# Patient Record
Sex: Female | Born: 1970
Health system: Southern US, Community
[De-identification: ages and names within clinical notes are randomized; demographics above are authoritative.]

## PROBLEM LIST (undated history)

## (undated) DIAGNOSIS — E669 Obesity, unspecified: Secondary | ICD-10-CM

## (undated) DIAGNOSIS — I10 Essential (primary) hypertension: Secondary | ICD-10-CM

## (undated) DIAGNOSIS — S86019A Strain of unspecified Achilles tendon, initial encounter: Secondary | ICD-10-CM

## (undated) DIAGNOSIS — F329 Major depressive disorder, single episode, unspecified: Secondary | ICD-10-CM

## (undated) DIAGNOSIS — F319 Bipolar disorder, unspecified: Secondary | ICD-10-CM

## (undated) DIAGNOSIS — F32A Depression, unspecified: Secondary | ICD-10-CM

## (undated) DIAGNOSIS — E782 Mixed hyperlipidemia: Secondary | ICD-10-CM

## (undated) HISTORY — PX: BREAST BIOPSY: SHX20

## (undated) HISTORY — DX: Mixed hyperlipidemia: E78.2

## (undated) HISTORY — PX: BREAST EXCISIONAL BIOPSY: SUR124

## (undated) HISTORY — DX: Major depressive disorder, single episode, unspecified: F32.9

## (undated) HISTORY — DX: Essential (primary) hypertension: I10

## (undated) HISTORY — PX: LASIK: SHX215

## (undated) HISTORY — DX: Obesity, unspecified: E66.9

## (undated) HISTORY — DX: Strain of unspecified achilles tendon, initial encounter: S86.019A

## (undated) HISTORY — DX: Depression, unspecified: F32.A

---

## 1997-09-11 ENCOUNTER — Other Ambulatory Visit: Admission: RE | Admit: 1997-09-11 | Discharge: 1997-09-11 | Payer: Self-pay | Admitting: Obstetrics and Gynecology

## 1998-09-24 ENCOUNTER — Other Ambulatory Visit: Admission: RE | Admit: 1998-09-24 | Discharge: 1998-09-24 | Payer: Self-pay | Admitting: Obstetrics and Gynecology

## 1999-03-14 ENCOUNTER — Encounter: Payer: Self-pay | Admitting: Obstetrics and Gynecology

## 1999-03-14 ENCOUNTER — Inpatient Hospital Stay (HOSPITAL_COMMUNITY): Admission: AD | Admit: 1999-03-14 | Discharge: 1999-03-14 | Payer: Self-pay | Admitting: Obstetrics and Gynecology

## 1999-04-19 ENCOUNTER — Other Ambulatory Visit: Admission: RE | Admit: 1999-04-19 | Discharge: 1999-04-19 | Payer: Self-pay | Admitting: Obstetrics and Gynecology

## 1999-11-14 ENCOUNTER — Inpatient Hospital Stay (HOSPITAL_COMMUNITY): Admission: AD | Admit: 1999-11-14 | Discharge: 1999-11-17 | Payer: Self-pay | Admitting: Obstetrics and Gynecology

## 1999-11-18 ENCOUNTER — Encounter (HOSPITAL_COMMUNITY): Admission: RE | Admit: 1999-11-18 | Discharge: 2000-01-17 | Payer: Self-pay | Admitting: Obstetrics and Gynecology

## 2000-01-02 ENCOUNTER — Other Ambulatory Visit: Admission: RE | Admit: 2000-01-02 | Discharge: 2000-01-02 | Payer: Self-pay | Admitting: Obstetrics and Gynecology

## 2000-01-31 HISTORY — PX: LAPAROSCOPIC CHOLECYSTECTOMY: SUR755

## 2001-01-18 ENCOUNTER — Other Ambulatory Visit: Admission: RE | Admit: 2001-01-18 | Discharge: 2001-01-18 | Payer: Self-pay | Admitting: Obstetrics and Gynecology

## 2002-01-28 ENCOUNTER — Other Ambulatory Visit: Admission: RE | Admit: 2002-01-28 | Discharge: 2002-01-28 | Payer: Self-pay | Admitting: Obstetrics and Gynecology

## 2003-02-13 ENCOUNTER — Other Ambulatory Visit: Admission: RE | Admit: 2003-02-13 | Discharge: 2003-02-13 | Payer: Self-pay | Admitting: Obstetrics and Gynecology

## 2004-02-19 ENCOUNTER — Other Ambulatory Visit: Admission: RE | Admit: 2004-02-19 | Discharge: 2004-02-19 | Payer: Self-pay | Admitting: Obstetrics and Gynecology

## 2004-03-01 ENCOUNTER — Encounter: Admission: RE | Admit: 2004-03-01 | Discharge: 2004-03-01 | Payer: Self-pay | Admitting: Obstetrics and Gynecology

## 2004-10-14 ENCOUNTER — Other Ambulatory Visit: Admission: RE | Admit: 2004-10-14 | Discharge: 2004-10-14 | Payer: Self-pay | Admitting: Obstetrics and Gynecology

## 2005-03-24 ENCOUNTER — Inpatient Hospital Stay (HOSPITAL_COMMUNITY): Admission: AD | Admit: 2005-03-24 | Discharge: 2005-03-24 | Payer: Self-pay | Admitting: Obstetrics and Gynecology

## 2005-04-21 ENCOUNTER — Inpatient Hospital Stay (HOSPITAL_COMMUNITY): Admission: AD | Admit: 2005-04-21 | Discharge: 2005-04-23 | Payer: Self-pay | Admitting: Obstetrics and Gynecology

## 2005-10-15 ENCOUNTER — Inpatient Hospital Stay (HOSPITAL_COMMUNITY): Admission: EM | Admit: 2005-10-15 | Discharge: 2005-10-28 | Payer: Self-pay | Admitting: Emergency Medicine

## 2005-11-09 ENCOUNTER — Encounter: Admission: RE | Admit: 2005-11-09 | Discharge: 2005-11-09 | Payer: Self-pay | Admitting: General Surgery

## 2005-11-24 ENCOUNTER — Encounter: Admission: RE | Admit: 2005-11-24 | Discharge: 2005-11-24 | Payer: Self-pay | Admitting: General Surgery

## 2005-12-27 ENCOUNTER — Encounter: Admission: RE | Admit: 2005-12-27 | Discharge: 2005-12-27 | Payer: Self-pay | Admitting: General Surgery

## 2005-12-29 ENCOUNTER — Encounter: Admission: RE | Admit: 2005-12-29 | Discharge: 2005-12-29 | Payer: Self-pay | Admitting: General Surgery

## 2006-01-01 ENCOUNTER — Encounter: Admission: RE | Admit: 2006-01-01 | Discharge: 2006-01-01 | Payer: Self-pay | Admitting: General Surgery

## 2006-01-09 ENCOUNTER — Encounter (INDEPENDENT_AMBULATORY_CARE_PROVIDER_SITE_OTHER): Payer: Self-pay | Admitting: *Deleted

## 2006-01-09 ENCOUNTER — Inpatient Hospital Stay (HOSPITAL_COMMUNITY): Admission: RE | Admit: 2006-01-09 | Discharge: 2006-01-17 | Payer: Self-pay | Admitting: General Surgery

## 2006-03-22 ENCOUNTER — Encounter: Admission: RE | Admit: 2006-03-22 | Discharge: 2006-03-22 | Payer: Self-pay | Admitting: General Surgery

## 2007-10-31 ENCOUNTER — Ambulatory Visit (HOSPITAL_COMMUNITY): Admission: RE | Admit: 2007-10-31 | Discharge: 2007-10-31 | Payer: Self-pay | Admitting: Obstetrics and Gynecology

## 2007-11-15 IMAGING — US US ABDOMEN COMPLETE
1 series · 14 of 25 positions shown · non-contrast
Comparison: none

CLINICAL DATA: Abdominal pain.

[Series 1: unknown · 0.32mm/px · 14 of 64 slices shown]
[im 1/64]
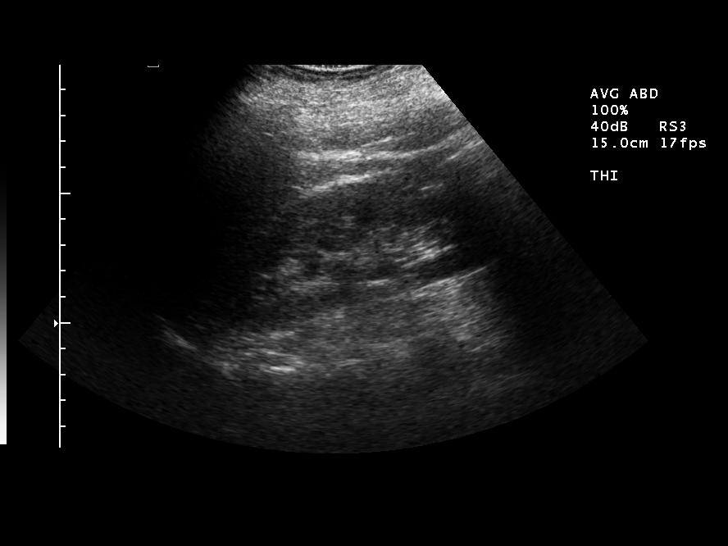
[im 6/64]
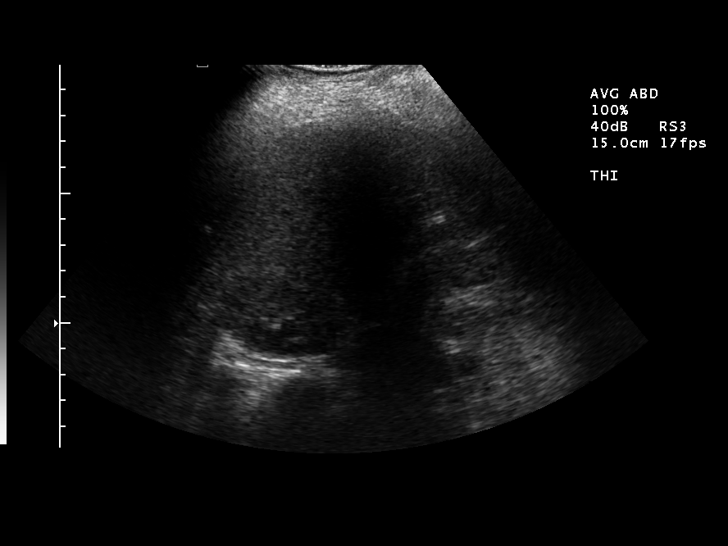
[im 11/64]
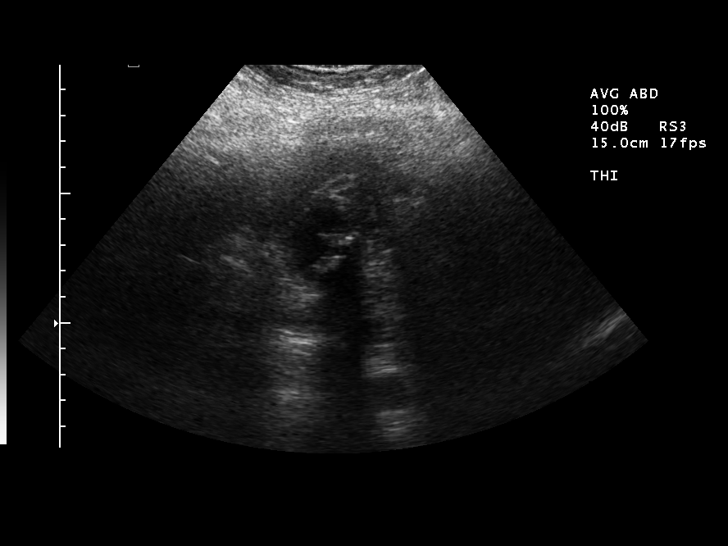
[im 16/64]
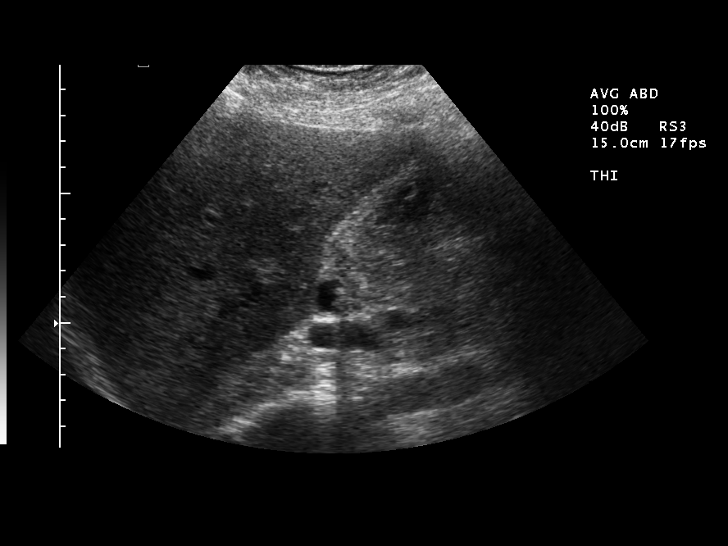
[im 22/64]
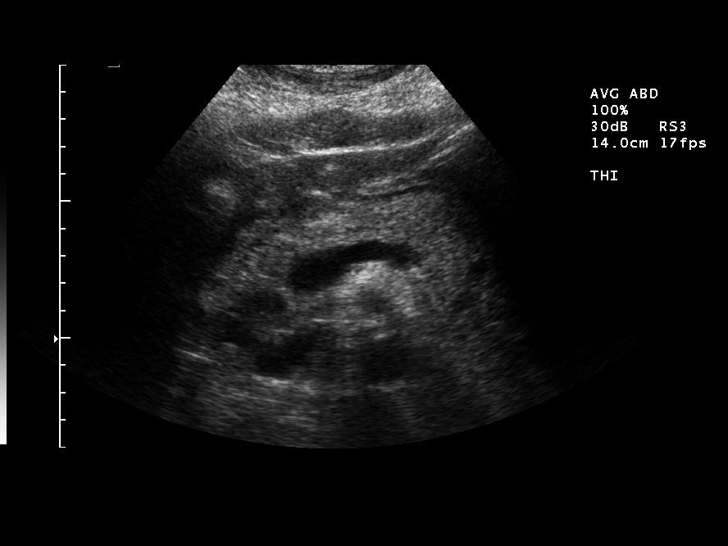
[im 24/64]
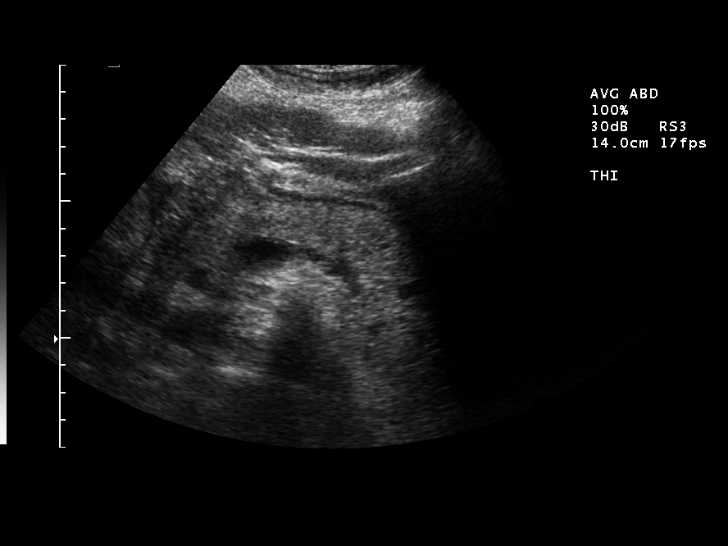
[im 29/64]
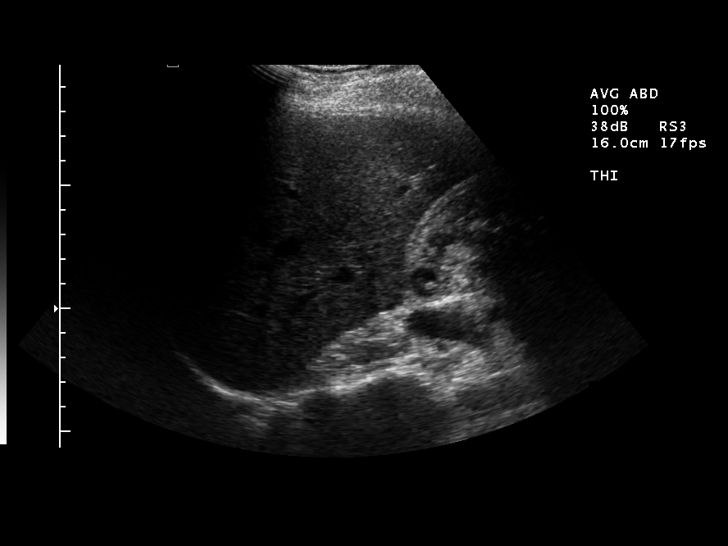
[im 35/64]
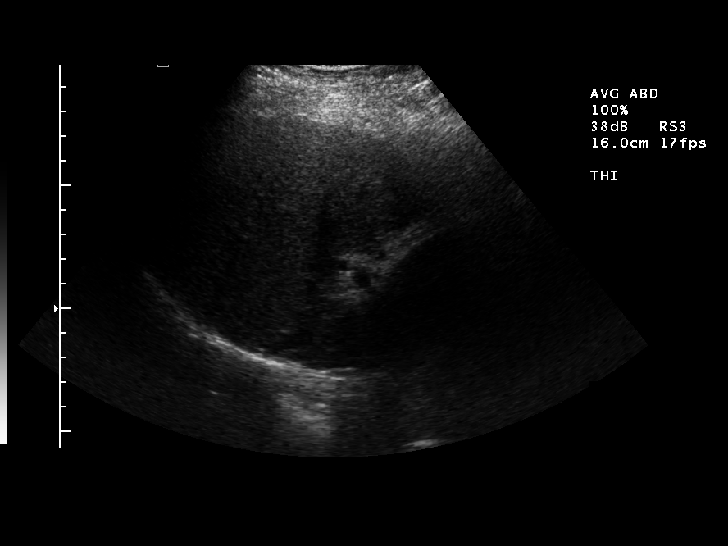
[im 40/64]
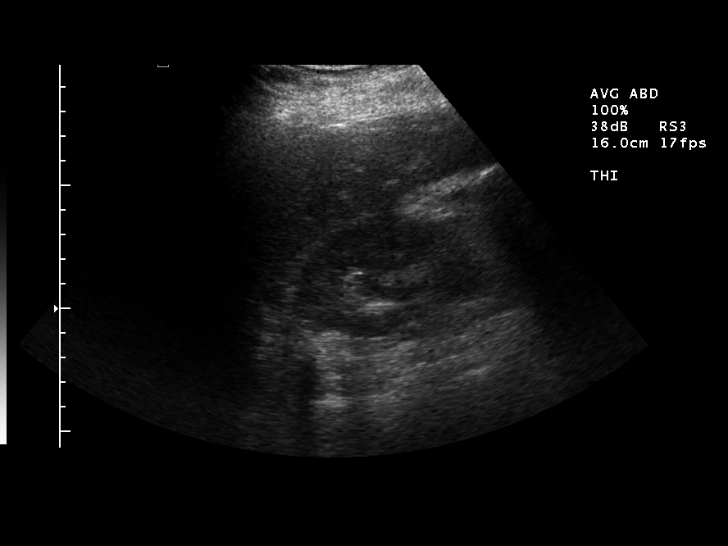
[im 43/64]
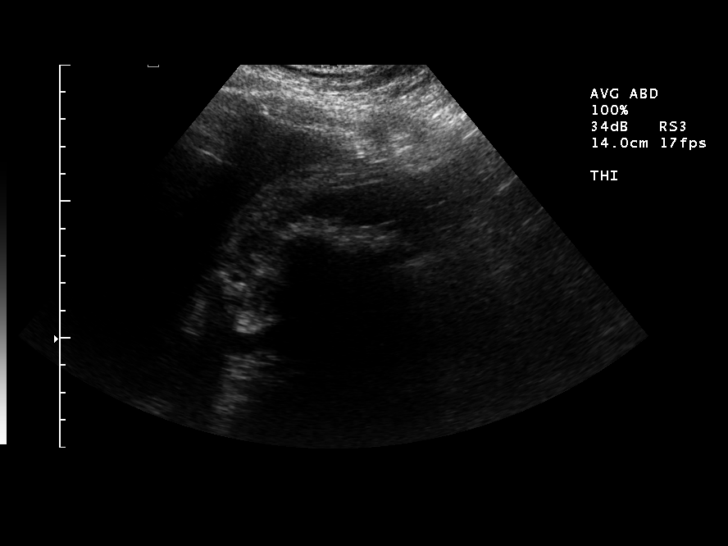
[im 48/64]
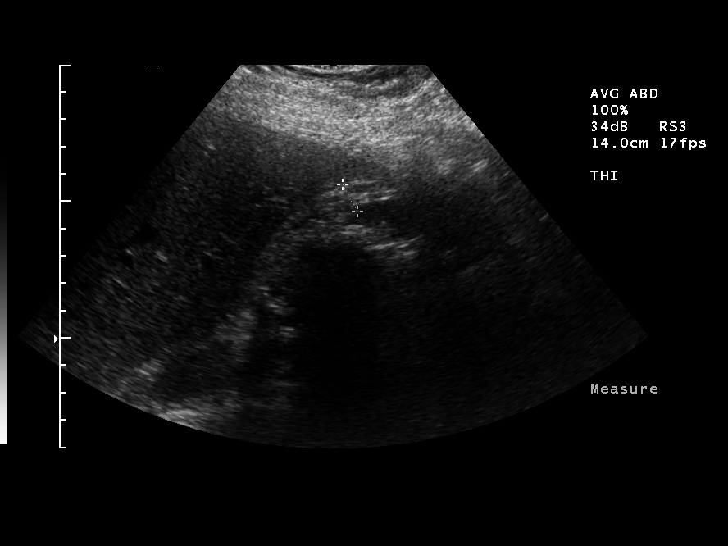
[im 53/64]
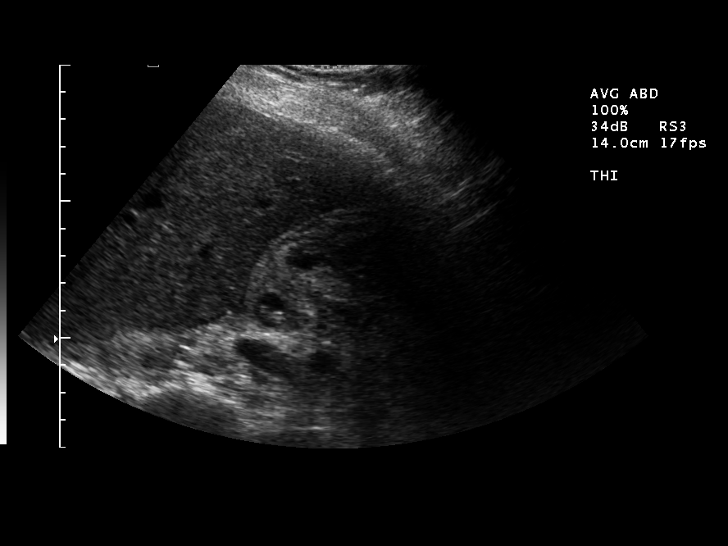
[im 58/64]
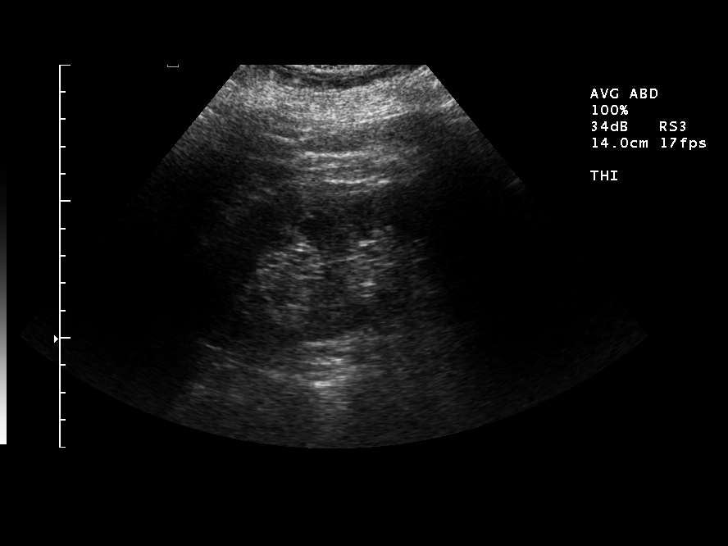
[im 64/64]
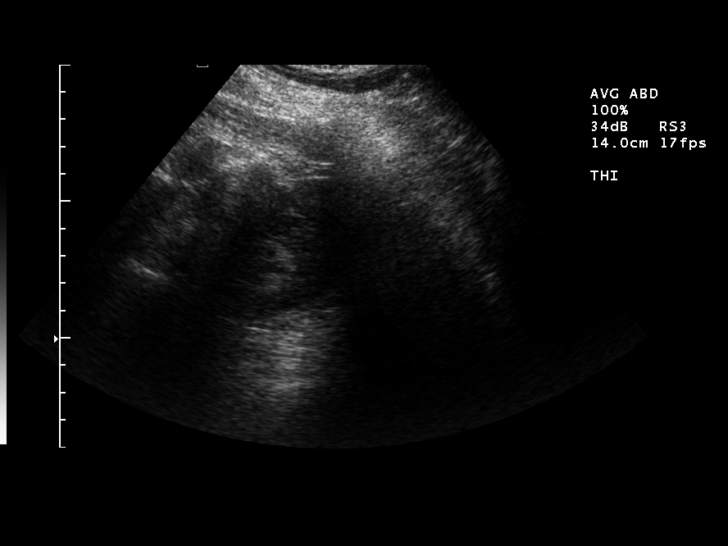

[14 of 25 positions shown; findings below may reference images not displayed]

Ultrasound abdomen:

No previous available for comparison. Gallbladder is incompletely distending
containing innumerable subcentimeter stones layering in its dependent portion.
There is marked gallbladder wall thickening measuring up to 11 mm thickness. No
definite pericholecystic fluid. Common bile duct is dilated, 7.9 mm diameter.
Liver homogeneous in echotexture without focal lesion or definite intrahepatic
bile duct dilatation. Visualized portions of pancreas, IVC, abdominal aorta
unremarkable. Spleen and kidneys unremarkable.
IMPRESSION: 1. Cholelithiasis and marked gallbladder wall thickening suggesting
cholecystitis. There is mild dilatation of the common bile duct. The findings
were reviewed over the phone with Dr. Misse.

## 2007-11-15 IMAGING — CT CT ABDOMEN W/ CM
2 of 5 series · 16 of 46 positions shown, 18 images · IV contrast (APPLIED)
Comparison: Ultrasound of earlier today.
COMPARISON: None.

CLINICAL DATA: Abdominal pain.  Ultrasound findings suspicious for cholecystitis.
 ABDOMEN CT WITH CONTRAST:
TECHNIQUE: Multidetector CT imaging of the abdomen was performed following the standard protocol during bolus administration of intravenous contrast.
 Contrast:  125 cc Omnipaque 300.
TECHNIQUE: Multidetector CT imaging of the pelvis was performed following the standard protocol during bolus administration of intravenous contrast.

[Series 2: abd_pel 5.0 b40f st · axial · 0.73mm/px · z∈[+906,+1306]mm · 13 of 91 slices shown, 15 images]
[im 6/91  soft-tissue]
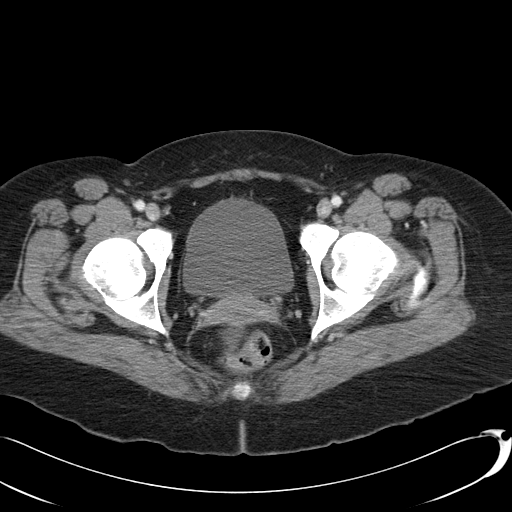
[im 6/91  bone]
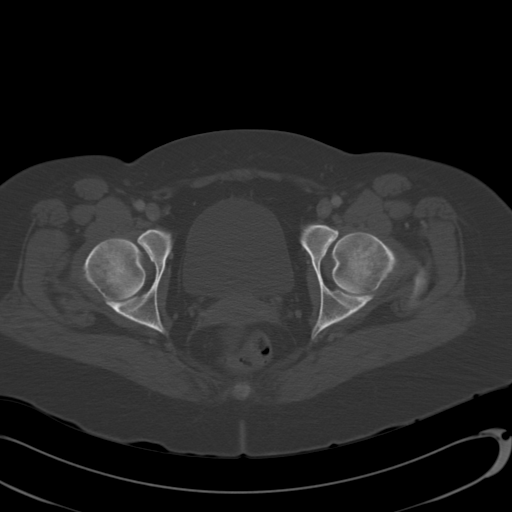
[im 11/91  soft-tissue]
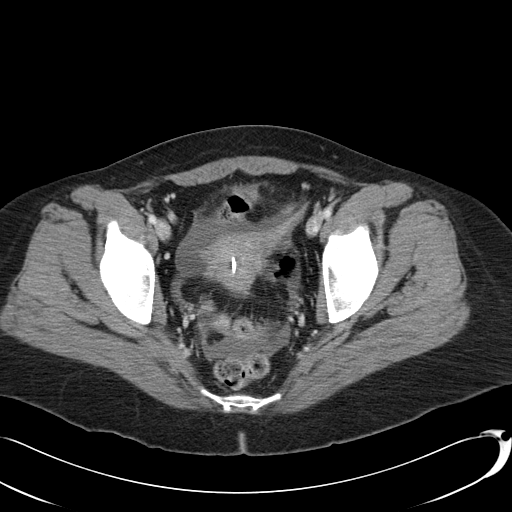
[im 21/91  soft-tissue]
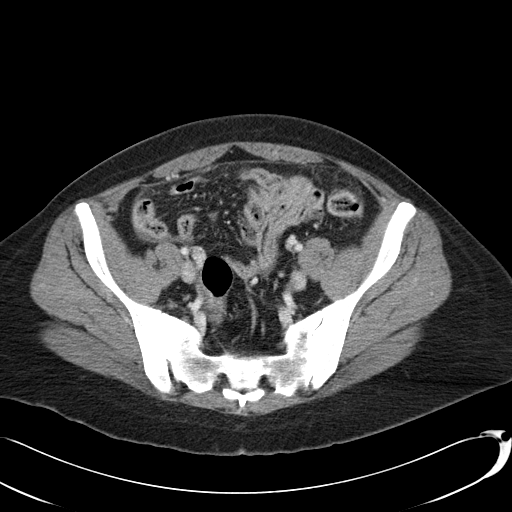
[im 26/91  soft-tissue]
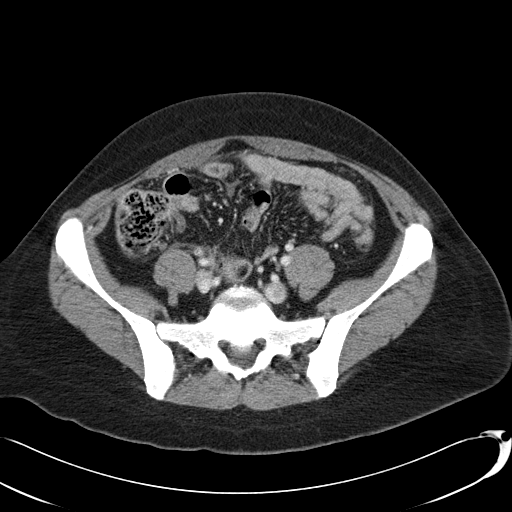
[im 31/91  soft-tissue]
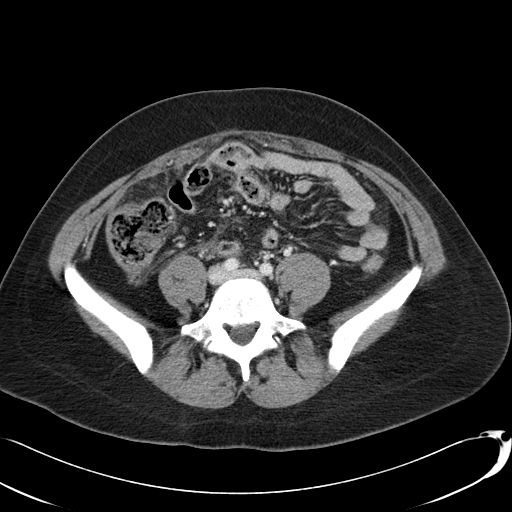
[im 41/91  soft-tissue]
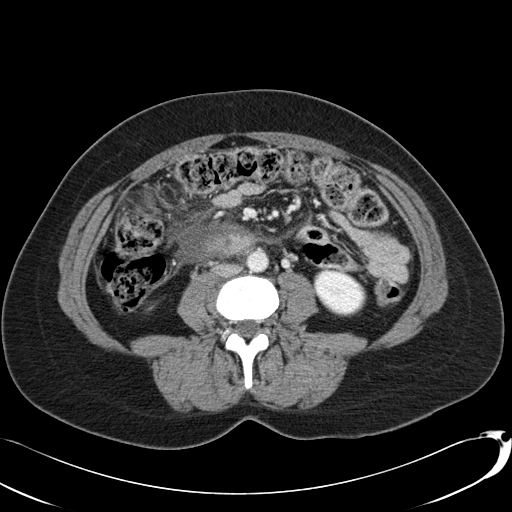
[im 46/91  soft-tissue]
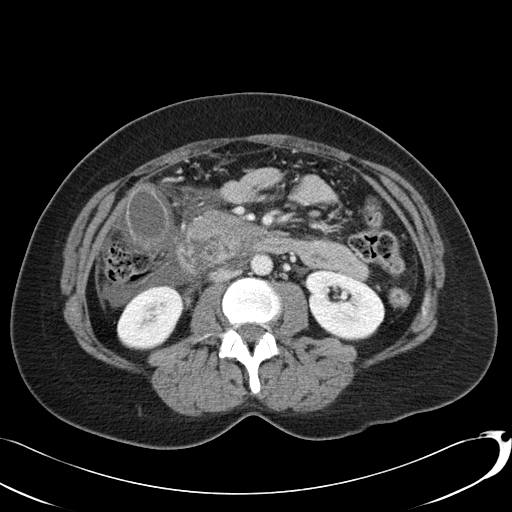
[im 51/91  soft-tissue]
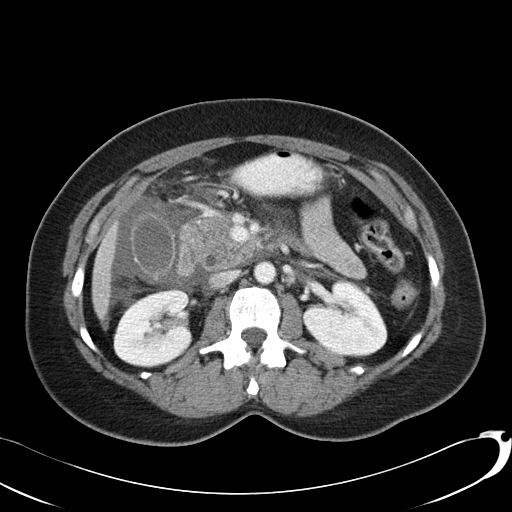
[im 61/91  soft-tissue]
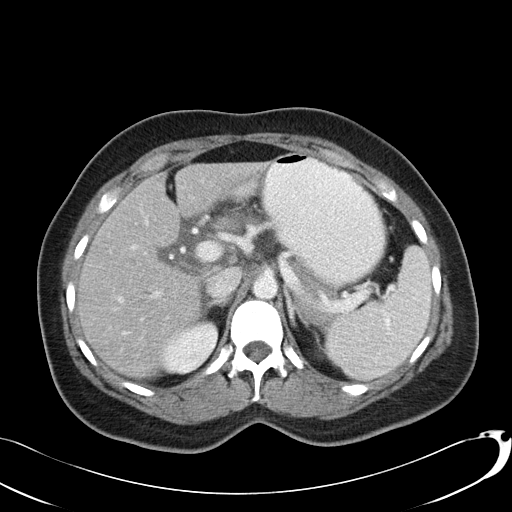
[im 61/91  bone]
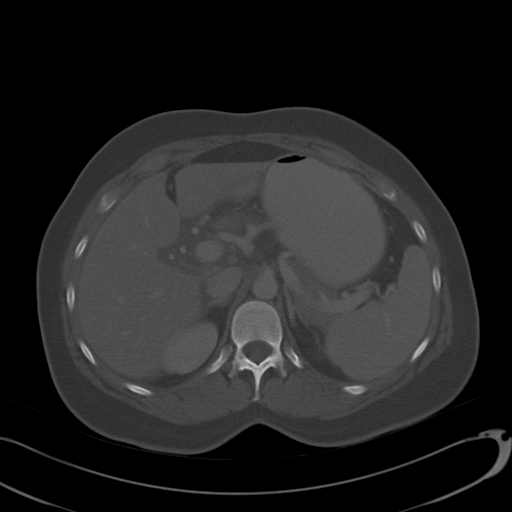
[im 66/91  soft-tissue]
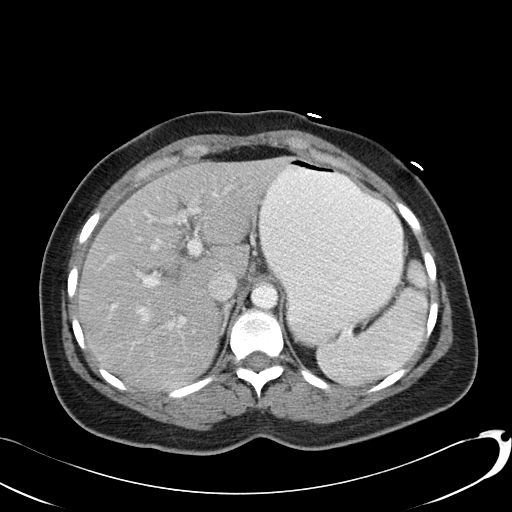
[im 71/91  soft-tissue]
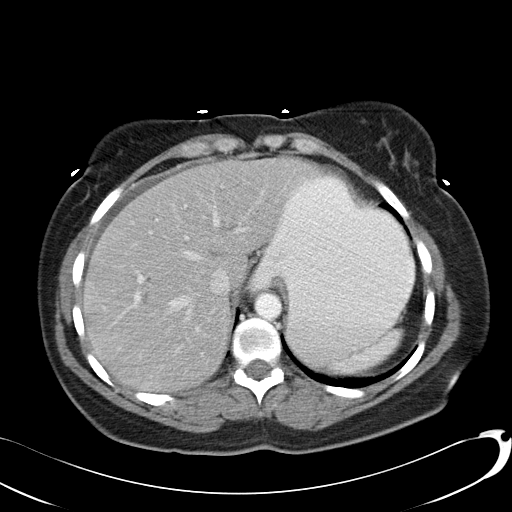
[im 81/91  soft-tissue]
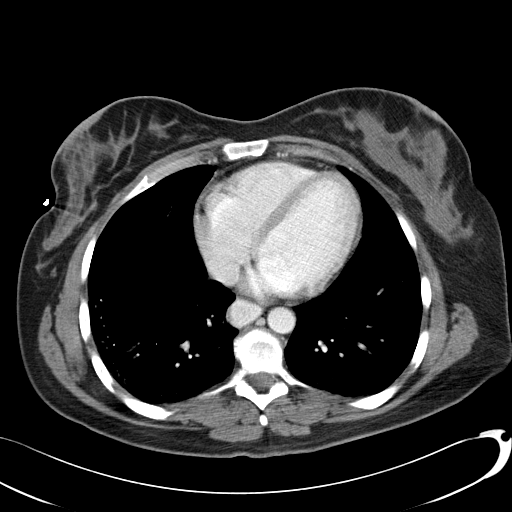
[im 86/91  soft-tissue]
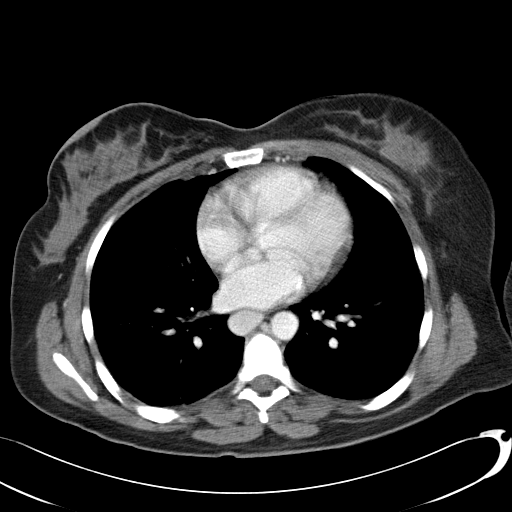

[Series 602: <mpr range>coronal · coronal · 0.92mm/px · 3 of 119 slices shown]
[im 40/119  soft-tissue]
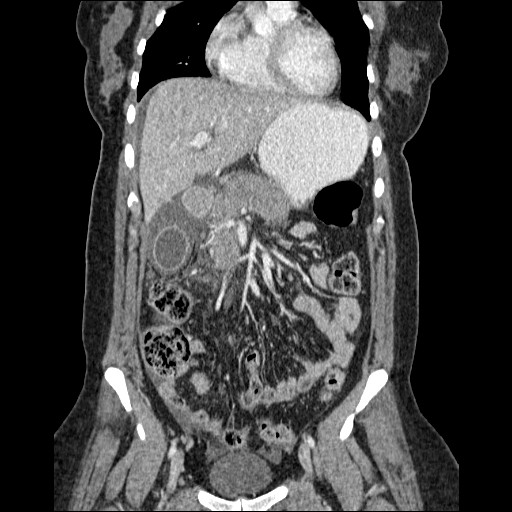
[im 53/119  soft-tissue]
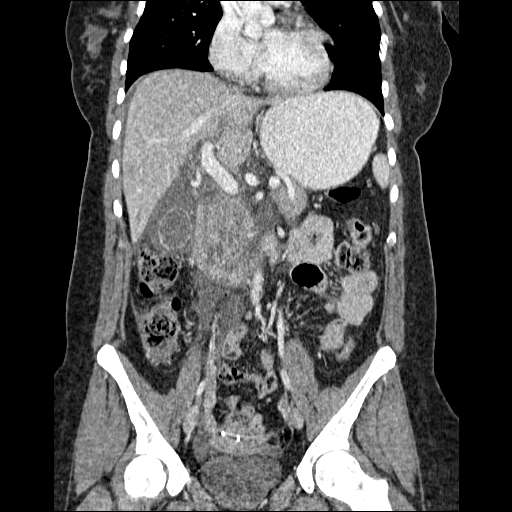
[im 66/119  soft-tissue]
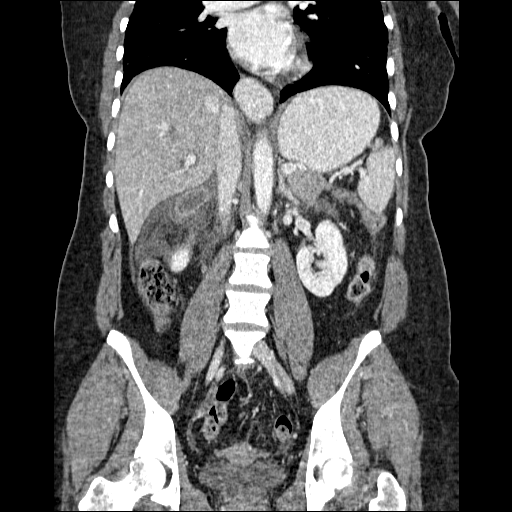

[16 of 46 positions shown; findings below may reference images not displayed]

FINDINGS: Mild right base atelectasis.  There is a moderate sized hiatal hernia.  The heart size is normal.  No pericardial or pleural effusion.  
 There is a small amount of perihepatic ascites.  No focal liver lesions.  
 The spleen and stomach are normal.  
 There is low density along the portal triads.  This is felt to represent a combination of periportal edema and mild intrahepatic biliary ductal dilatation.  The extrahepatic duct is dilated at 8 mm in the region of the pancreatic head.  There is a suggestion of increased density on image 47 just above the ampulla suspicious for choledocholithiasis.  
 The gallbladder is distended.  There is marked gallbladder wall thickening, as well as pericholecystic edema and fluid.  
 There is edema involving the pancreatic head and uncinate process.  The pancreatic body and tail are within normal limits.  
 Adrenal glands and kidneys are normal.  No retroperitoneal or retrocrural adenopathy.
 Colon, terminal ileum and appendix are normal (image 68 for the appendix).  Prominent ileocolic mesenteric lymph nodes are likely reactive.
IMPRESSION: 1.  Findings are most consistent with acute cholecystitis.  
 2.  Intra and extrahepatic biliary ductal dilatation.  Possible distal common duct stone.  Probable secondary pancreatitis primarily involving the head and uncinate process.  
 3.  Small amount of perihepatic ascites.  
 4.  Moderate sized hiatal hernia.  
 PELVIS CT WITH CONTRAST:
FINDINGS: Pelvic large and small bowel are normal.  There is a small amount of pelvic ascites.  No pelvic adenopathy.  
 The urinary bladder is normal.  There is an intrauterine device.  No adnexal mass.  
 Bone windows demonstrate no worrisome osseous lesion.
IMPRESSION: Small amount of pelvic ascites.  No acute process in the pelvis.

## 2008-06-17 ENCOUNTER — Encounter: Admission: RE | Admit: 2008-06-17 | Discharge: 2008-06-17 | Payer: Self-pay | Admitting: Family Medicine

## 2008-08-10 ENCOUNTER — Encounter: Payer: Self-pay | Admitting: Gastroenterology

## 2008-08-28 ENCOUNTER — Encounter: Payer: Self-pay | Admitting: Gastroenterology

## 2008-09-22 ENCOUNTER — Telehealth (INDEPENDENT_AMBULATORY_CARE_PROVIDER_SITE_OTHER): Payer: Self-pay | Admitting: *Deleted

## 2008-09-22 ENCOUNTER — Encounter: Payer: Self-pay | Admitting: Gastroenterology

## 2008-09-22 DIAGNOSIS — R933 Abnormal findings on diagnostic imaging of other parts of digestive tract: Secondary | ICD-10-CM

## 2008-10-15 ENCOUNTER — Ambulatory Visit (HOSPITAL_COMMUNITY): Admission: RE | Admit: 2008-10-15 | Discharge: 2008-10-15 | Payer: Self-pay | Admitting: Gastroenterology

## 2008-10-15 ENCOUNTER — Ambulatory Visit: Payer: Self-pay | Admitting: Gastroenterology

## 2010-02-19 ENCOUNTER — Encounter: Payer: Self-pay | Admitting: Obstetrics and Gynecology

## 2010-02-20 ENCOUNTER — Encounter: Payer: Self-pay | Admitting: General Surgery

## 2010-05-06 LAB — TYPE AND SCREEN
ABO/RH(D): A POS
Antibody Screen: NEGATIVE

## 2010-06-14 NOTE — Op Note (Signed)
NAMEBRYANNAH, Pamela Chandler             ACCOUNT NO.:  [id number]   MEDICAL RECORD NO.:  [id number]          PATIENT TYPE:  AMB   LOCATION:  SDC                           FACILITY:  [facility]   PHYSICIAN:  Juluis Mire, M.D.   DATE OF BIRTH:  Jul 09, 1970   DATE OF PROCEDURE:  10/31/2007  DATE OF DISCHARGE:                               OPERATIVE REPORT   PREOPERATIVE DIAGNOSES:  1. Stress urinary incontinence.  2. Urethral hypermobility.   POSTOPERATIVE DIAGNOSIS:  1. Stress urinary incontinence.  2. Urethral hypermobility.   OPERATIVE PROCEDURE:  Midurethral sling using the transobturator  approach and cystoscopy.   SURGEON:  Juluis Mire, MD   ANESTHESIA:  General.   ESTIMATED BLOOD LOSS:  200 mL.   PACKS AND DRAINS:  None.   INTRAOPERATIVE BLOOD PLACEMENT:  None.   COMPLICATIONS:  None.   Indications are as noted in history and physical.   Procedure as follows, the patient was taken to OR, placed in supine  position.  After satisfactory level of general endotracheal anesthesia  was obtained, the patient was placed in the dorsal lithotomy position  using the Allen stirrups.  The perineum and vagina prepped out with  Betadine and draped sterile field.  A Foley was placed into the urethra  and the bladder was drained.  The Foley was unclamped.  The suburethral  vaginal mucosa was instilled with 1% Xylocaine.  No epinephrine was  used.  Vaginal incision was made in the midurethral area and we  dissected out lateral to the obturator foramen on each side.  We did  this through blunt and sharp dissection.  We then went to the perineum.  An area below the abductus longus tendon on the lateral aspect of the  inferior pubic ramus was identified and infiltrated with Xylocaine.  A  punch incision was made.  Next, using the halo needles, they were passed  through the obturator foramen and wrapped around the inferior pubic  ramus out through the vaginal incision on each side.  No  buttonholing of  the vagina was noted.  Cystoscopy was then performed.  The bladder was  intact.  There was no evidence of injury to to it.  Both the ureteral  orifices were identified noted to be spilling clear urine.  Evaluation  of the urethra was also intact.  The cystoscope was then removed.  The  polypropylene mesh was put in place and attached to the needles and  brought out through the obturator foramen on each side.  The mesh was  then adjusted under the midurethral, but it did lie flat, had good  support but we were able to put a Kelly rotated 90 degrees, so was not  excessively tight.  The edge of the polypropylene was then cut flush  with the skin.  Some areas of bleeding were noted.  These brought in  control with figure-of-eights of 2-0 Vicryl.  The vaginal mucosa was  then closed with running suture of 2-0 Vicryl.  Foley was placed back to  straight drain.  Clear urine was identified.  No active bleeding was  noted.  Skin incisions were closed with Dermabond.  The patient taken  out of dorsal lithotomy position.  Once alert and extubated, transferred  to recovery room in good condition.  Sponge, instrument, and needle  count were correct by circulating nurse x2.      Juluis Mire, M.D.  Electronically Signed     JSM/MEDQ  D:  10/31/2007  T:  10/31/2007  Job:  [id number]

## 2010-06-14 NOTE — H&P (Signed)
NAME:  Pamela Chandler, Pamela Chandler             ACCOUNT NO.:  [id number]   MEDICAL RECORD NO.:  [id number]          PATIENT TYPE:  AMB   LOCATION:                                FACILITY:  [facility]   PHYSICIAN:  Juluis Mire, M.D.   DATE OF BIRTH:  11/29/1970   DATE OF ADMISSION:  10/31/2007  DATE OF DISCHARGE:                              HISTORY & PHYSICAL   The patient is a 40 year old, gravida 3, para 3, female who presents for  mid urethral sling for management of stress incontinence.  The patient  has had worsening of stress incontinence.  She leaks urine with  activities such as exercise, lifting, or any type of sneezing or  coughing.  This is becoming limiting.  She has no leakage at night or  with other events.  Urodynamic testing was performed in the office.  She  had a minimal residual volume.  During filling, there were no signs of  uninhibited bladder contractions.  Leak point pressures were between 80  and 100 with coughing and Valsalva.  Urethral pressure profiles were  normal, and her voiding pattern was normal.  There was no evidence of an  obstructive voiding pattern.  Therefore, she seems like she has normal  bladder function.  The only thing that is limiting is genuine stress  incontinence from urethral hypermobility.  Options were discussed  including biofeedback versus surgical management.  She now presents for  a mid urethral sling.   In terms of allergies, she reports allergies to [facility] and EPINEPHRINE.   MEDICATIONS:  Relpax.   PAST MEDICAL HISTORY:  Usual childhood diseases.  No significant  sequelae.  She did have gallbladder disease and had an open  cholecystectomy removal of a pancreatic cyst.   OBSTETRICAL HISTORY:  She had three vaginal deliveries.   FAMILY HISTORY:  There is a history of heart disease, thyroid disease,  arthritis, and breast cancer.   The patient has minimal alcohol use.  No smoking history in terms of  social history.   Review of systems is  noncontributory.   PHYSICAL EXAMINATION:  GENERAL:  The patient is afebrile, stable vital  signs.  HEENT:  The patient is normocephalic.  Pupils equal, round, and react to  light and accommodation.  Extraocular movements were intact.  Sclerae  and conjunctivae clear.  Oropharynx clear.  NECK:  Without thyromegaly.  BREASTS:  No discrete masses.  LUNGS:  Clear.  CARDIOVASCULAR:  Regular rate.  No murmurs or gallops.  ABDOMEN:  She has a midline incision which is intact.  Otherwise, no  mass, organomegaly, or tenderness.  PELVIC:  Normal external genitalia.  Vaginal mucosa reveals mild  cystourethrocele.  Cervix unremarkable.  Uterus normal size, shape, and  contour.  Adnexa free of masses or tenderness.  EXTREMITIES:  Trace edema.  NEUROLOGIC:  Grossly within normal limits.   IMPRESSION:  1. Genuine stress incontinence with urethral hypermobility.  2. History of migraine headaches.   PLAN:  The patient will undergo mid urethral sling.  Success rates of  80% are quoted.  We have discussed potential risks and complications.  Risks could include infection.  The risk of vascular injury could be due  to hemorrhage requiring transfusion with the risk of AIDS or hepatitis.  Risk of injury to adjacent organs including bladder, urethra, or ureters  that could require removal of the sling.  There is the risk of injury to  the obturator nerve that could lead to chronic leg pain and weakness.  There is a risk of mesh erosion, this could require surgical removal of  the mesh.  Over tightening of the sling could lead to inability to void  requiring removing the sling with return of incontinence.  There is a  risk of developing bladder spasms, this can lead to continued leaking  requiring long-term medication.  Finally, there is a risk of deep venous  thrombosis and some pulmonary embolus.  The patient expressed understand  of alternatives, potential risks, and complications.      Juluis Mire, M.D.  Electronically Signed     JSM/MEDQ  D:  10/31/2007  T:  10/31/2007  Job:  [id number]

## 2010-06-17 NOTE — Consult Note (Signed)
Pamela Chandler, Pamela Chandler             ACCOUNT NO.:  [id number]   MEDICAL RECORD NO.:  [id number]          PATIENT TYPE:  INP   LOCATION:  1612                         FACILITY:  [facility]   PHYSICIAN:  Petra Kuba, M.D.    DATE OF BIRTH:  1970-10-23   DATE OF CONSULTATION:  10/15/2005  DATE OF DISCHARGE:                                   CONSULTATION   HISTORY:  The patient seen at the request of Dr. Derrell Lolling for gallstone  pancreatitis.  She had a few mild attacks over the last few months to years,  but never sought medical attention until this week, when she had recurring  pain and nausea and some dark urine, which got better.  Supposedly, liver  tests at Dr. Pablo Lawrence office were high, but then they went down and the  ultrasound did show gallstones and a slightly dilated CBD.  She was suppose  to be referred to a surgeon, but with increased pain and nausea, presented  to the emergency room.  Labs revealed gallstone pancreatitis and she was  admitted by Dr. Derrell Lolling for further workup and plans.   Her past medical history is pertinent for some headaches and migraines, but  no previous surgeries.   ALLERGIES ARE TO BIAXIN AND EPINEPHRINE.   Family history is pertinent for some gallbladder issues.   SOCIAL HISTORY:  Does not drink or smoke, but will use some nonsteroidals  periodically for headaches and muscle aches.   Medicine is a migraine medicine p.r.n. only.   REVIEW OF SYSTEMS:  Pertinent for her urine that has gotten lighter.  She is  not sure if she had any previous bouts where her urine had gotten dark  before.  No other complaints.   PHYSICAL EXAMINATION:  GENERAL:  Sedated, but answering all questions  appropriately.  LUNGS:  Clear.  HEART:  Regular rate and rhythm.  ABDOMEN:  Pertinent for decreased bowel sounds.  Soft, nontender in the  lower abdomen.  Tender with minimal guarding in the upper abdomen.  No  rebound.   LABS:  Pertinent for significantly increased amylase  and lipase.  White  count at 12.  Bilirubin at 3.8 with an alkaline phosphatase of 146 and  transaminase is in the 100s.  Other labs are okay.   CT discussed with the radiologist, pertinent for cholecystitis,  pancreatitis, and dilated CBD, which seems to taper at the ampulla, but no  definitive CBD stone.   PLAN:  Await tonight and a.m. labs to decide whether she needs an ERCP,  laparoscopic cholecystectomy, and intraoperative cholangiogram, versus  watching and waiting and letting the pancreatitis settle down, versus even  an MRCP to better delineate the duct.  I have discussed all of the above  with the patient and her family, including risks, benefits, and methods  involving the above.  Specifically, urgent ERCP and making the pancreatitis  worse, versus  risks of even elective ERCP.  I did draw them pictures so they better  understand the anatomy and the situation.  Will also try to get Dr. Pablo Lawrence  labs and their office records in  the morning.  Will follow with you.  In the  meantime, agree with antibiotic, pain control, etc.           ______________________________  Petra Kuba, M.D.     MEM/MEDQ  D:  10/15/2005  T:  10/15/2005  Job:  [id number]   cc:   Angelia Mould. Derrell Lolling, M.D.  Petra Kuba, M.D.  Robert L. Foy Guadalajara, M.D.

## 2010-06-17 NOTE — Op Note (Signed)
Pamela Chandler, Pamela Chandler             ACCOUNT NO.:  [id number]   MEDICAL RECORD NO.:  [id number]          PATIENT TYPE:  INP   LOCATION:  0008                         FACILITY:  [facility]   PHYSICIAN:  Angelia Mould. Derrell Lolling, M.D.DATE OF BIRTH:  [birth year]   DATE OF PROCEDURE:  01/09/2006  DATE OF DISCHARGE:                               OPERATIVE REPORT   PREOPERATIVE DIAGNOSIS:  Gallstone pancreatitis, chronic pancreatic  pseudocyst.   POSTOPERATIVE DIAGNOSES:  1. Gallstone pancreatitis.  2. Choledocholithiasis.  3. Complex pancreatic pseudocyst, possibly infected   OPERATION PERFORMED:  1. Exploratory laparotomy with open cholecystectomy.  2. Intraoperative cholangiogram.  3. Intraoperative ultrasound of pancreatic pseudocyst.  4. Unroofing, evacuation, and external drainage of pancreatic      pseudocyst.   SURGEON:  Dr. Claud Kelp   FIRST ASSISTANT:  Dr. Ovidio Kin   OPERATIVE INDICATIONS:  This is a 40 year old white female who presented  with severe biliary pancreatitis in September of this year.  She was  hospitalized for almost 4 weeks with fairly significant inflammatory  changes in her abdomen by CT and abdominal tenderness, but she never  became septic or required any operative intervention.  She was  ultimately sent home on hyperalimentation and then became asymptomatic,  and ultimately we were able to put her back on a low-fat diet with  pancreatic enzyme supplementation.  She occasionally will have brief  attacks of upper abdominal pain.  She has a palpable mass in the right  upper quadrant.  Her CT scans have shown development of acute pancreatic  pseudocyst into a chronic pancreatic pseudocyst.  On her last CT, the  wall of the pseudocyst looked much thicker and looked larger, and we  felt that we would proceed with surgery at this time because of the risk  of secondary infection of the pseudocyst and/or complications such as  erosion into hollow viscous or  bleeding.  Her care was discussed with  Dr. Vida Rigger.  It was his preference to perform postoperative ERCP if  we should find that she has common duct stones and is on standby for  that.   OPERATIVE FINDINGS:  The patient's gallbladder contained gallstones, was  chronically inflamed.  There were soft adhesions to the gallbladder from  the transverse colon.  The anatomy of the cystic duct, cystic artery,  and common bile duct were conventional.  The cholangiogram showed 2  small common bile duct stones distally but otherwise normal intrahepatic  and extrahepatic biliary anatomy.  There was a little bit of contrast  that went into the duodenum.  Common bile duct was not dilated.  There  was a pseudocyst presenting both above the transverse colon and inferior  to the transverse colon.  The upper part went behind the transverse  colon and was related to the second and third portion of the duodenum.  The inferior part came out very posteriorly at the root of the mesentery  and had several blood vessels in the wall of it that could be palpated  and seen by ultrasound.  Ultrasound of the pseudocyst both above and  below showed that  it was very complex with a lot of internal echoes.  This was not suspected from the CT scans which suggested that it was a  homogeneous fluid density.  The contents of the pseudocyst were  semisolid and consistent with necrotic fat and necrotic peripancreatic  tissue that had a little bit of an odor.  Cultures were taken.  It was  felt that it might have been infected.  It was not appropriate to do any  kind of internal drainage of this pseudocyst.  The small bowel was  basically normal, a couple of adhesions to the root of the mesentery  from the pancreatitis.  The colon felt normal.  The stomach felt normal.  The liver looked normal and felt normal.  There was no ascites.   OPERATIVE TECHNIQUE:  Following the induction of general endotracheal  anesthesia, the  patient's abdomen was prepped and draped in a sterile  fashion.  Intravenous antibiotics were given prior to the incision.  The  patient was identified as to correct patient and correct procedure.  Upper midline incision was made.  The abdominal cavity was entered  through the midline fascia and explored with findings as described  above.  Self-retaining retractors were placed.   I mobilized the gallbladder by controlling it with Kelly clamps.  I took  all the adhesions down.  I incised the peritoneum around the lower  gallbladder and carefully dissected out the cystic duct and the cystic  artery.  I carefully isolated the cystic artery as it went on the wall  of the gallbladder, secured it with metal clips and divided it.  I then  skeletonized the cystic duct, made a large window behind the cystic  duct.  A cholangiogram catheter was inserted into the cystic duct.  Cholangiogram was obtained using the C-arm.  This showed normal  intrahepatic and extrahepatic biliary anatomy, no dilatation, but a  couple of small stones in the distal common bile duct, and a little bit  of contrast went into the duodenum.   The cholangiogram catheter was removed.  The cystic duct was secured  with multiple metal clips and divided.  I had a phone conversation with  Dr. Vida Rigger, who stated that it he would perform ERCP tomorrow with  sphincterotomy to remove the common duct stones.  The patient's anatomy  was such that I felt that this would be reasonable since there did not  seem to be very much inflammation around the duodenal C-loop or the  common duct.   I then turned my attention to the pseudocyst.  I used the ultrasound to  image it, both above and below the transverse colon.  It looked quite  complex with a lot of internal echoes.  I found one very thin-walled  area above the transverse colon and stuck a large needle in this, but I got no fluid.  I spent some time mobilizing some of the soft  tissues  around this, but there never really was a very satisfactory area that  looked like it would be amenable to internal drainage.  I bluntly  entered the pseudocyst above the transverse colon anterolateral to the  head of the pancreas and once I got into this, I found that it was  semisolid, brownish yellow material.  Aerobic and anaerobic cultures  were sent.  I opened this area up and then evacuated all the contents  and irrigated it out extensively.  I could palpate the lower more  dependent part of  the pseudocyst with my finger and found a thin area  and incised this with the cautery.  There was a bleeding vessel in the  wall of this opening which had to be suture ligated both above and below  with a suture ligature of 2-0 silk.  I then continued to irrigate and  evacuate the pseudocyst until all the material was gone.  The internal  wall of the pseudocyst was a little bit friable, oozed a little bit but  after about 30 minutes, we really did not think it was bleeding very  much at all.   We irrigated out all the rest of the abdominal area, found no bleeding  or problems anywhere.  The NG tube was positioned.  I placed a 19-French  Blake drain into the more inferior dependent opening in the pseudocyst  and brought this out through an incision in the right lower quadrant,  connected it to a suction bulb and sutured to the skin with a nylon  suture.  I took another 19-French Harrison Mons drain and brought it through the  subhepatic space and then into the more superior opening of the  pseudocyst and brought it out through a right subcostal incision sutured  to the skin with a nylon suture and connected it through a suction bulb.  We checked the position of the drains, checked for  bleeding.  Everything looked fine.  Midline fascia was closed with a  running suture of #1 PDS and the skin closed with skin staples.  Clean  bandages were placed and the patient taken to recovery room in  stable  condition.  Estimated blood loss was about 150 mL.  Complications none,  sponge, needle, and instrument counts were correct.      Angelia Mould. Derrell Lolling, M.D.  Electronically Signed     HMI/MEDQ  D:  01/09/2006  T:  01/09/2006  Job:  [id number]   cc:   Petra Kuba, M.D.  Fax: [phone]   Doris Cheadle. Foy Guadalajara, M.D.  Fax: [phone]

## 2010-06-17 NOTE — Op Note (Signed)
Pamela Chandler, Pamela Chandler             ACCOUNT NO.:  [id number]   MEDICAL RECORD NO.:  [id number]          PATIENT TYPE:  INP   LOCATION:  1604                         FACILITY:  [facility]   PHYSICIAN:  Petra Kuba, M.D.    DATE OF BIRTH:  [birth year]   DATE OF PROCEDURE:  01/16/2006  DATE OF DISCHARGE:                               OPERATIVE REPORT   PROCEDURE:  Endoscopic retrograde cholangiopancreatography,  sphincterotomy, stone extraction.   INDICATION:  Patient with positive IOC after a significant bout of  gallstone pancreatitis.  Consent was signed after risks, benefits,  methods, options thoroughly discussed multiple times in the past with  both the patient and her family.   MEDICINES USED:  Fentanyl 175 mcg, Versed 12 mg.   PROCEDURE:  The side-viewing therapeutic video duodenoscope was inserted  by indirect vision into the stomach, some clear fluid was suctioned, and  advanced through a normal antrum and pylorus into the duodenum.  A  normal small ampulla was brought into view.  Using the triple-lumen  sphincterotome, deep selective cannulation was obtained on the first  attempt.  The Al Pimple  was advanced into the intrahepatic.  The CBD was  not overfilled, but few small stones were seen on initial injection.  The CBD was only slightly dilated.  We went ahead and proceeded with a  medium-sized sphincterotomy until we could get the fully-bowed  sphincterotome easily in and out of the duct.  There were no PD  injections throughout the procedure.  We went ahead and exchanged the  sphincterotome for the adjustable 9-12 mm balloon and on the first  attempt withdrew at least three stones using the 12 mm balloon.  We went  ahead and proceeded with three more balloon pull-throughs.  No debris,  sludge or further stones were seen.  We then proceeded with an occlusion  cholangiogram, which did not reveal any residual stones.  Once the  balloon was withdrawn through the sphincterotomy  site, there was  excellent biliary drainage and the CBD filled with air.  We went ahead  and proceeded with one more balloon pull-through, again without  delivering any further abnormalities.  We elected to stop the procedure  at this juncture.  The wire was removed, as was the balloon, air was  suctioned, the scope removed.  The patient tolerated the procedure well.  There was no obvious immediate complication.   ENDOSCOPIC DIAGNOSES:  1. Normal but small ampulla.  2. No pancreatic duct injections.  3. Few stones in a slightly dilated common bile duct.  4. Did not overfill the intrahepatics.  5. Therapy:  Moderate-sized sphincterotomy with at least three stones      being removed using the 9-12 adjustable balloon.  6. Negative occlusion cholangiogram at the end of the procedure.  7. Excellent biliary drainage at the end of the procedure.   PLAN:  Observe for delayed complications.  If none, home probably  tomorrow per surgical team.  Happy to see back p.r.n.           ______________________________  Petra Kuba, M.D.     MEM/MEDQ  D:  01/16/2006  T:  01/17/2006  Job:  [id number]   cc:   Angelia Mould. Derrell Lolling, M.D.  [address], [address]  [place]  [place] [address]   Doris Cheadle. Foy Guadalajara, M.D.  Fax: [phone]

## 2010-06-17 NOTE — H&P (Signed)
Pamela Chandler, Pamela Chandler             ACCOUNT NO.:  [id number]   MEDICAL RECORD NO.:  [id number]          PATIENT TYPE:  INP   LOCATION:  1612                         FACILITY:  [facility]   PHYSICIAN:  Angelia Mould. Derrell Lolling, M.D.DATE OF BIRTH:  [birth year]   DATE OF ADMISSION:  10/15/2005  DATE OF DISCHARGE:                                HISTORY & PHYSICAL   CHIEF COMPLAINT:  Abdominal pain, nausea, vomiting, gallstones.   HISTORY OF PRESENT ILLNESS:  This is a 40 year old white female who has a 7-  day history of intermittent upper abdominal pain, back pain, nausea and  vomiting.  She has had a little bit of diarrhea with this.  She saw Dr.  Marinda Elk on Tuesday, September 11.  By her history, she had an  ultrasound at [facility] which showed gallstones.  She states  she saw Dr. Foy Guadalajara on September 13, and the plan was for her to make  appointment to see a surgeon for consideration of cholecystectomy.  She  states that Dr. Foy Guadalajara told her she had gallstones and was a little bit  jaundiced and that she might have a bile duct stone.  The pain has been much  more severe for the past 24-36 hours.  She was brought to the ER by [facility], and her husband and in-laws and mother are here with her.  I was asked  to see her by Dr. Donnetta Hutching.   She has no past history of liver disease, gastrointestinal disease, renal  problems, cardiovascular disease, or pulmonary disease.   PAST HISTORY:  She has never had an operation and has no medical problems  other than migraine headaches.   CURRENT MEDICATIONS:  Relpax p.r.n. headache.   DRUG ALLERGIES:  EPINEPHRINE, BIAXIN.   SOCIAL HISTORY:  Married, has 2 children.  Works as a Catering manager for [employer].  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  Denies use of tobacco.  Drinks alcohol rarely.   FAMILY HISTORY:  Mother living and well.  Father, living, has had a stroke  and five heart attacks. There is no history of cancer in the immediate  family.   REVIEW OF  SYSTEMS:  A 15-system Review of Systems is performed and is  noncontributory except as described above.   PHYSICAL EXAMINATION:  GENERAL:  A cooperative but somnolent, somewhat  overweight white female in moderate distress.  She has an IV that has  already been started, and she had been given Dilaudid IV prior to my  arrival.  VITAL SIGNS: Temperature 97.4, pulse 85, respirations 18, blood pressure  139/65.  EYES:  Sclerae are not really icteric. Extraocular movements intact.  EARS, NOSE, MOUTH, THROAT: Nose, lips, tongue, and oropharynx without gross  lesions.  NECK: Supple, nontender.  No mass.  No jugular distension.  LUNGS: Clear to auscultation.  There may be a little bit of tenderness to  percuss at the right costovertebral angle.  HEART:  Regular rate and rhythm.  No murmur.  Radial and dorsalis pedis  pulses are palpable.  No peripheral edema.  BREASTS:  Not examined.  ABDOMEN: Obese, soft in the lower abdomen,  tender all the way across the  upper abdomen right and left and epigastrium.  A little bit of guarding.  No  mass.  Bowel sounds are hypoactive.  Liver and spleen are not obviously  enlarged.  No hernias or masses noted.  EXTREMITIES: She moves all extremities well without pain or deformity.  NEUROLOGIC: No gross motor or sensory deficits.   ADMISSION DATA:  Ultrasound shows numerous gallstones, some thickening of  the gallbladder wall and common bile duct slightly dilated at 7.5 mm.   Lipase is 4303, amylase 1322, total bilirubin 3.8, alkaline phosphatase 146.  The SGOT and SGPT are mildly elevated.  Urinalysis is pending.  Potassium  was 3.3.  White blood cell count 12,200, hemoglobin 14.9.   ASSESSMENT:  1. Biliary pancreatitis, moderately severe  2. Acute and chronic cholecystitis with cholelithiasis.   PLAN:  1. The patient will be admitted for supportive care.  2. She will be hydrated fairly aggressively  3. Because of the possibility of acute  cholecystitis, we will start her on      intravenous antibiotics.  4. We will proceed with a CT scan of the abdomen and pelvis now to see if      we can get anatomic assessment of the severity of pancreatitis.   Dr. Vida Rigger has been called and will see the patient in consultation from  a GI specialty standpoint.      Angelia Mould. Derrell Lolling, M.D.  Electronically Signed     HMI/MEDQ  D:  10/15/2005  T:  10/15/2005  Job:  [id number]   cc:   Molly Maduro L. Foy Guadalajara, M.D.  Fax: [phone]   Petra Kuba, M.D.  Fax: [phone]

## 2010-06-17 NOTE — Discharge Summary (Signed)
Pamela Chandler, Pamela Chandler             ACCOUNT NO.:  [id number]   MEDICAL RECORD NO.:  [id number]          PATIENT TYPE:  INP   LOCATION:  1612                         FACILITY:  [facility]   PHYSICIAN:  Angelia Mould. Derrell Lolling, M.D.DATE OF BIRTH:  December 22, 1970   DATE OF ADMISSION:  10/15/2005  DATE OF DISCHARGE:  10/28/2005                                 DISCHARGE SUMMARY   FINAL DIAGNOSES:  1. Acute, moderately severe, biliary pancreatitis.  2. Gallstones.   OPERATIONS PERFORMED:  None.   HISTORY:  This is a 40 year old white female who presented with a 7-day  history of intermittent upper abdominal pain, back pain, nausea and  vomiting.  She had seen Dr. Marinda Chandler on Tuesday, September 11.  She  states she had an ultrasound at [facility] which showed  gallstones.  She saw Dr. Foy Chandler again on September 13 and the plan was for  her to make an appointment to see a surgeon for consideration of  cholecystectomy.  The pain has been more severe for the past 24-36 hours.  She is brought to the ER by [facility]. I was asked to see her by Dr.  Donnetta Chandler, the emergency department physician.   PAST MEDICAL HISTORY:  Past history was negative for any liver disease,  gastrointestinal disease, renal disease, cardiovascular disease or pulmonary  disease.  She had never had an operation.   PHYSICAL EXAMINATION:  GENERAL:  Cooperative, slightly overweight white  female in moderate distress.  VITAL SIGNS:  Temperature 97.4, pulse 85, respirations 18, blood pressure  139/65.  ABDOMEN:  Significant physical findings were limited to the abdomen which  was slightly obese, soft and the lower abdomen tender all the way across the  upper abdomen right and left and in the epigastrium. A little bit of  guarding, no mass.  Bowel sounds were hypoactive.  Liver and spleen were not  enlarged.   ADMISSION DATA:  Ultrasound showed numerous gallstones, some thickening of  the gallbladder wall. The common  bile duct measured 7.5 mm.  Lipase was  4303, amylase 1322, total bilirubin 3.8, alkaline phosphatase 146.  Mild  elevation of SGOT and SGPT.  White blood cell count 12,200, hemoglobin 14.9.   HOSPITAL COURSE:  The patient was admitted, placed on bowel rest, IV  hydration, analgesics and antibiotics.  She was seen shortly thereafter in  consultation by Dr. Vida Rigger of the [facility] group.  He felt  that it was unclear whether she had a retained common bile duct stone and  felt that it would be wise to wait until the following day to see what the  lab results were.  He followed her closely throughout her hospital course as  did his partner Dr. Molly Maduro Buccini.   The following day the patient was more alert, still had pain but no fever.  Her heart rate was running 120-140 but she looked very good clinically.  Lab  work showed a white count of 11,600, hemoglobin of 15.7, still with  significantly elevated amylase and total bilirubin was 3.0.  CT scan showed  inflammatory changes around the head  of the pancreas and gallbladder.  Gallstones were noted.  It was not clear whether they could say whether she  had a common bile duct stone or not. I felt that she was sequestering third  space fluid in her abdomen and we increased her IV rate.  Dr. Matthias Hughs did  not feel that she needed an ERCP at this point.   Over the next few days, she slowly felt better but still had pain and upper  abdominal tenderness and then became more in the right upper quadrant and  the left upper quadrant. She was started on hyperalimentation shortly after  admission, continued on IV antibiotics and placed on proton pump inhibitors.  A CT scan was done on September 20 which showed significant edema of the  head of the Pancrease but the pancreas was well perfused. There was lots of  inflammatory in the epigastric, right upper quadrant and right flank. I felt  that she had moderately severe pancreatitis. Her  liver function tests were  trending downward. We decided to hold off on any consideration of surgery  because her pancreatitis seemed to be the main issue and acute cholecystitis  did not seem to be a likely problem. We discontinued her antibiotics on  September 24 and she did not require any further antibiotics thereafter.  She had some diarrhea.  Clostridium difficile was negative x2.  She did not  act like she had a colitis clinically.  We allowed sips of clear liquids on  September 26 but continued mostly n.p.o. on hyperalimentation.  It became  more apparent that there was a fullness in the right upper quadrant.  We  repeated her CT scan on September 27.  This showed a well-perfused pancreas  but still lots of changes of pancreatitis and significant fluid collections  were present but were stable.  There was no enhancing pseudocyst noted.  The  CT scan did not suggest acute cholecystitis.   Because of the patient's slowly evolving pancreatitis and the fact that she  may or may not developed a pseudocyst, we discussed the long-term plan.  She  asked if she could go home.  Dr. Matthias Hughs and I discussed this and decided  that it would be reasonably safe to send her home on hyperalimentation but  at bowel rest and no caloric intake.  I planned a followup CT scan in 1-2  weeks to monitor the evolution of the fluid collections.  Therefore she was  discharged, I believe, on September 29.  She was asked to follow up with me  in my office in 1 week.  Hyperalimentation was to be arranged by [facility].   DISCHARGE MEDICATIONS:  Protonix 40 mg a day, Vicodin for pain and Pancrease  and Creon tablets before any oral intake.      Angelia Mould. Derrell Lolling, M.D.  Electronically Signed     HMI/MEDQ  D:  11/12/2005  T:  11/13/2005  Job:  [id number]   cc:   Molly Maduro L. Pamela Chandler, M.D.  Fax: [phone]   Bernette Redbird, M.D.  Fax: [phone]

## 2010-06-17 NOTE — Discharge Summary (Signed)
NAMEALETTE, KATAOKA             ACCOUNT NO.:  [id number]   MEDICAL RECORD NO.:  [id number]          PATIENT TYPE:  INP   LOCATION:  1604                         FACILITY:  [facility]   PHYSICIAN:  Angelia Mould. Derrell Lolling, M.D.DATE OF BIRTH:  Aug 07, 1970   DATE OF ADMISSION:  01/09/2006  DATE OF DISCHARGE:  01/17/2006                               DISCHARGE SUMMARY   FINAL DIAGNOSIS:  1. Gallstone pancreatitis.  2. Choledocholithiasis.  3. Complex, infected pancreatic pseudocyst.   OPERATIONS PERFORMED:  1. Cholecystectomy with intraoperative cholangiogram, intraoperative      ultrasound, external drainage of infected pancreatic pseudocyst;      date 01/10/1996.  2. Endoscopic retrograde cholangiopancreatography with endoscopic      sphincterotomy and common duct stone removal; date 01/16/2006.   HISTORY:  This is a 40 year old white female who was admitted to this  hospital approximately 3 months ago with moderately severe pancreatitis.  This was due to gallstones.  She had a fairly prolonged hospitalization  requiring prolonged bowel rest; and IV antibiotics and  hyperalimentation.  She ultimately improved, but had fairly significant  immature fluid collections in the upper abdomen.  She was followed as an  outpatient, initially on hyperalimentation and bowel rest; but  ultimately she was weaned off of hyperalimentation; and was able to  resume a diet.  She continued to have a palpable mass in the right upper  quadrant; and by CT, she progressively developed a thick-walled, complex  pseudocyst in the right upper quadrant extending around the head of the  pancreas anteriorly and then inferiorly under the transverse colon  mesentery.  Once it became apparent that the pseudocyst was not going to  resolve, Dr. Ewing Schlein and I discussed a management care plan; and she was  admitted to hospital for surgery, and possible ERCP if necessary.   PHYSICAL EXAM:  On admission this was a pleasant young  woman in no  distress.  She was afebrile.  EYES:  Sclerae were clear.  Extraocular  was intact.  LUNGS:  Clear to auscultation.  No chest wall tenderness.  HEART:  Regular rate and rhythm, no murmur.  Radial and femoral pulses  are palpable.  ABDOMEN: Soft, nontender, a baseball-size palpable mass  in the right upper quadrant.  EXTREMITIES:  He moves all 4 extremities  well without pain or deformity.  NEUROLOGIC:  No gross motor sensory  deficits.   HOSPITAL COURSE:  On the day of admission the patient was taken to the  operating room and underwent a laparotomy through an upper midline  incision.  We removed her gallbladder.  A cholangiogram was performed  which showed 2 or 3 small common bile duct stones.  There was a lot of  inflammation around the head of pancreas.  There was a complex  pseudocyst.  We have had very complex internal echoes.  This was not  amenable to internal drainage; and had to be opened both superiorly and  inferiorly; and we had to evacuate semisolid necrotic material, and  irrigate this out, and place external drains.  The cultures from the  pseudocyst grew E-coli.  Postoperatively the patient did well.  Dr. Vida Rigger followed the  patient to plan for ERCP and sphincterotomy.  We got her nasogastric  tube out on postoperative day #1.  Her Jackson-Pratt drainage was fairly  benign, thin and bland appearing.  She had an ileus for a few days, but  ultimately that resolved; and we were able to begin a diet by about the  fifth postop day.  Dr. Ewing Schlein elected to delay her ERCP until Tuesday,  December 18.  On December 18 he performed an ERCP and did a  sphincterotomy and removed some small common duct stones.  She had no  complications from that; and was discharged home on 01/17/2006.  At that  time she had no pain; was afebrile; vital signs were stable.  Her  Jackson-Pratt drains were ultimately removed prior to discharge as they  were draining almost nothing.  Her  white blood cell count was 7400 and  hemoglobin 12.3 on discharge.   DISCHARGE MEDICATIONS INCLUDE:  1. Vicodin for pain.  2. Cipro 500 mg p.o. b.i.d. x3 days.  3. She was to continue on pancreatic enzymes, temporarily.  She is to      follow up with me in the office in 2-3 weeks.      Angelia Mould. Derrell Lolling, M.D.  Electronically Signed     HMI/MEDQ  D:  02/02/2006  T:  02/02/2006  Job:  [id number]   cc:   Petra Kuba, M.D.  Fax: [phone]   Doris Cheadle. Foy Guadalajara, M.D.  Fax: [phone]

## 2010-09-05 ENCOUNTER — Other Ambulatory Visit: Payer: Self-pay | Admitting: *Deleted

## 2010-09-05 DIAGNOSIS — F419 Anxiety disorder, unspecified: Secondary | ICD-10-CM

## 2010-09-05 NOTE — Telephone Encounter (Signed)
Patient phoned requesting a refill on her Xanax, unable to get in with family services for another month.  Originally Rx'd April 2011 for Xanax .25 mg twice daily #60 with 3 refills.

## 2010-09-06 MED ORDER — ALPRAZOLAM 0.25 MG PO TABS: 0.2500 mg | ORAL_TABLET | Freq: Two times a day (BID) | ORAL | Status: AC | PRN

## 2010-09-06 NOTE — Telephone Encounter (Signed)
Okay to refill? 

## 2010-09-19 ENCOUNTER — Encounter: Payer: Self-pay | Admitting: *Deleted

## 2010-09-19 ENCOUNTER — Institutional Professional Consult (permissible substitution): Payer: Self-pay | Admitting: Family Medicine

## 2010-10-31 LAB — CBC
HCT: 42.2
Hemoglobin: 14
MCHC: 33.1
MCV: 91.9
RBC: 4.59
RDW: 11.9

## 2014-03-28 ENCOUNTER — Encounter (HOSPITAL_COMMUNITY): Payer: Self-pay | Admitting: Emergency Medicine

## 2014-03-28 ENCOUNTER — Emergency Department (HOSPITAL_COMMUNITY): Payer: BLUE CROSS/BLUE SHIELD

## 2014-03-28 ENCOUNTER — Emergency Department (HOSPITAL_COMMUNITY)
Admission: EM | Admit: 2014-03-28 | Discharge: 2014-03-28 | Disposition: A | Discharge status: Home / Self Care | Payer: BLUE CROSS/BLUE SHIELD | Attending: Emergency Medicine | Admitting: Emergency Medicine

## 2014-03-28 DIAGNOSIS — I10 Essential (primary) hypertension: Secondary | ICD-10-CM | POA: Diagnosis not present

## 2014-03-28 DIAGNOSIS — F419 Anxiety disorder, unspecified: Secondary | ICD-10-CM | POA: Insufficient documentation

## 2014-03-28 DIAGNOSIS — E669 Obesity, unspecified: Secondary | ICD-10-CM | POA: Diagnosis not present

## 2014-03-28 DIAGNOSIS — F319 Bipolar disorder, unspecified: Secondary | ICD-10-CM | POA: Diagnosis not present

## 2014-03-28 DIAGNOSIS — Z87891 Personal history of nicotine dependence: Secondary | ICD-10-CM | POA: Insufficient documentation

## 2014-03-28 DIAGNOSIS — Z79899 Other long term (current) drug therapy: Secondary | ICD-10-CM | POA: Diagnosis not present

## 2014-03-28 HISTORY — DX: Bipolar disorder, unspecified: F31.9

## 2014-03-28 LAB — CBC WITH DIFFERENTIAL/PLATELET
Basophils Absolute: 0 10*3/uL (ref 0.0–0.1)
Basophils Relative: 0 % (ref 0–1)
EOS PCT: 0 % (ref 0–5)
Eosinophils Absolute: 0 10*3/uL (ref 0.0–0.7)
HCT: 41.3 % (ref 36.0–46.0)
HEMOGLOBIN: 14.1 g/dL (ref 12.0–15.0)
Lymphocytes Relative: 21 % (ref 12–46)
Lymphs Abs: 1.9 10*3/uL (ref 0.7–4.0)
MCH: 30.4 pg (ref 26.0–34.0)
MCHC: 34.1 g/dL (ref 30.0–36.0)
MCV: 89 fL (ref 78.0–100.0)
Monocytes Absolute: 0.4 10*3/uL (ref 0.1–1.0)
Monocytes Relative: 5 % (ref 3–12)
Neutro Abs: 6.6 10*3/uL (ref 1.7–7.7)
Neutrophils Relative %: 74 % (ref 43–77)
PLATELETS: 223 10*3/uL (ref 150–400)
RBC: 4.64 MIL/uL (ref 3.87–5.11)
RDW: 12.1 % (ref 11.5–15.5)
WBC: 9 10*3/uL (ref 4.0–10.5)

## 2014-03-28 LAB — I-STAT TROPONIN, ED: Troponin i, poc: 0 ng/mL (ref 0.00–0.08)

## 2014-03-28 LAB — BASIC METABOLIC PANEL
ANION GAP: 7 (ref 5–15)
BUN: 11 mg/dL (ref 6–23)
CALCIUM: 9.2 mg/dL (ref 8.4–10.5)
CO2: 26 mmol/L (ref 19–32)
Chloride: 104 mmol/L (ref 96–112)
Creatinine, Ser: 0.75 mg/dL (ref 0.50–1.10)
GFR calc Af Amer: >90 mL/min (ref >90)
GLUCOSE: 94 mg/dL (ref 70–99)
Potassium: 3.9 mmol/L (ref 3.5–5.1)
Sodium: 137 mmol/L (ref 135–145)

## 2014-03-28 LAB — URINALYSIS, ROUTINE W REFLEX MICROSCOPIC
BILIRUBIN URINE: NEGATIVE
Glucose, UA: NEGATIVE mg/dL
Hgb urine dipstick: NEGATIVE
Ketones, ur: NEGATIVE mg/dL
Leukocytes, UA: NEGATIVE
Nitrite: NEGATIVE
Protein, ur: NEGATIVE mg/dL
Specific Gravity, Urine: 1.005 (ref 1.005–1.030)
Urobilinogen, UA: 0.2 mg/dL (ref 0.0–1.0)
pH: 6.5 (ref 5.0–8.0)

## 2014-03-28 MED ORDER — LORAZEPAM 1 MG PO TABS: 1.0000 mg | ORAL_TABLET | Freq: Three times a day (TID) | ORAL | Status: AC | PRN

## 2014-03-28 NOTE — ED Notes (Signed)
Pt refused CXR that was ordered by PA

## 2014-03-28 NOTE — ED Notes (Signed)
Pt refusing xray.

## 2014-03-28 NOTE — Discharge Instructions (Signed)
Panic Attacks °Panic attacks are sudden, short-lived surges of severe anxiety, fear, or discomfort. They may occur for no reason when you are relaxed, when you are anxious, or when you are sleeping. Panic attacks may occur for a number of reasons:  °· Healthy people occasionally have panic attacks in extreme, life-threatening situations, such as war or natural disasters. Normal anxiety is a protective mechanism of the body that helps us react to danger (fight or flight response). °· Panic attacks are often seen with anxiety disorders, such as panic disorder, social anxiety disorder, generalized anxiety disorder, and phobias. Anxiety disorders cause excessive or uncontrollable anxiety. They may interfere with your relationships or other life activities. °· Panic attacks are sometimes seen with other mental illnesses, such as depression and posttraumatic stress disorder. °· Certain medical conditions, prescription medicines, and drugs of abuse can cause panic attacks. °SYMPTOMS  °Panic attacks start suddenly, peak within 20 minutes, and are accompanied by four or more of the following symptoms: °· Pounding heart or fast heart rate (palpitations). °· Sweating. °· Trembling or shaking. °· Shortness of breath or feeling smothered. °· Feeling choked. °· Chest pain or discomfort. °· Nausea or strange feeling in your stomach. °· Dizziness, light-headedness, or feeling like you will faint. °· Chills or hot flushes. °· Numbness or tingling in your lips or hands and feet. °· Feeling that things are not real or feeling that you are not yourself. °· Fear of losing control or going crazy. °· Fear of dying. °Some of these symptoms can mimic serious medical conditions. For example, you may think you are having a heart attack. Although panic attacks can be very scary, they are not life threatening. °DIAGNOSIS  °Panic attacks are diagnosed through an assessment by your health care provider. Your health care provider will ask  questions about your symptoms, such as where and when they occurred. Your health care provider will also ask about your medical history and use of alcohol and drugs, including prescription medicines. Your health care provider may order blood tests or other studies to rule out a serious medical condition. Your health care provider may refer you to a mental health professional for further evaluation. °TREATMENT  °· Most healthy people who have one or two panic attacks in an extreme, life-threatening situation will not require treatment. °· The treatment for panic attacks associated with anxiety disorders or other mental illness typically involves counseling with a mental health professional, medicine, or a combination of both. Your health care provider will help determine what treatment is best for you. °· Panic attacks due to physical illness usually go away with treatment of the illness. If prescription medicine is causing panic attacks, talk with your health care provider about stopping the medicine, decreasing the dose, or substituting another medicine. °· Panic attacks due to alcohol or drug abuse go away with abstinence. Some adults need professional help in order to stop drinking or using drugs. °HOME CARE INSTRUCTIONS  °· Take all medicines as directed by your health care provider.   °· Schedule and attend follow-up visits as directed by your health care provider. It is important to keep all your appointments. °SEEK MEDICAL CARE IF: °· You are not able to take your medicines as prescribed. °· Your symptoms do not improve or get worse. °SEEK IMMEDIATE MEDICAL CARE IF:  °· You experience panic attack symptoms that are different than your usual symptoms. °· You have serious thoughts about hurting yourself or others. °· You are taking medicine for panic attacks and   have a serious side effect. °MAKE SURE YOU: °· Understand these instructions. °· Will watch your condition. °· Will get help right away if you are not  doing well or get worse. °Document Released: 01/16/2005 Document Revised: 01/21/2013 Document Reviewed: 08/30/2012 °ExitCare® Patient Information ©2015 [employer]. This information is not intended to replace advice given to you by your health care provider. Make sure you discuss any questions you have with your health care provider. ° °Emergency Department Resource Guide °1) Find a Doctor and Pay Out of Pocket °Although you won't have to find out who is covered by your insurance plan, it is a good idea to ask around and get recommendations. You will then need to call the office and see if the doctor you have chosen will accept you as a new patient and what types of options they offer for patients who are self-pay. Some doctors offer discounts or will set up payment plans for their patients who do not have insurance, but you will need to ask so you aren't surprised when you get to your appointment. ° °2) Contact Your Local Health Department °Not all health departments have doctors that can see patients for sick visits, but many do, so it is worth a call to see if yours does. If you don't know where your local health department is, you can check in your phone book. The CDC also has a tool to help you locate your state's health department, and many state websites also have listings of all of their local health departments. ° °3) Find a Walk-in Clinic °If your illness is not likely to be very severe or complicated, you may want to try a walk in clinic. These are popping up all over the country in pharmacies, drugstores, and shopping centers. They're usually staffed by nurse practitioners or physician assistants that have been trained to treat common illnesses and complaints. They're usually fairly quick and inexpensive. However, if you have serious medical issues or chronic medical problems, these are probably not your best option. ° °No Primary Care Doctor: °- Call [facility] at  832-8000 - they can help you  locate a primary care doctor that  accepts your insurance, provides certain services, etc. °- Physician Referral Service- 1-800-533-3463 ° °Chronic Pain Problems: °Organization         Address  Phone   Notes  °[facility]  (336) 297-2271 Patients need to be referred by their primary care doctor.  ° °Medication Assistance: °Organization         Address  Phone   Notes  °[place] [facility] [address] °[place], [place] [address] (336) 641-8030 --Must be a resident of [place] °-- Must have NO insurance coverage whatsoever (no Medicaid/ Medicare, etc.) °-- The pt. MUST have a primary care doctor that directs their care regularly and follows them in the community °  °[facility]  (866) 331-1348   °[employer]  (888) 892-1162   ° °Agencies that provide inexpensive medical care: °Organization         Address  Phone   Notes  °[facility]  (336) 832-8035   °[facility]    (336) 832-7272   °[facility] [address] °[place], [place] [address] (336) 832-4777   °[facility] [address], ° (336) 271-4999   °[facility]    (336) 373-0678   °[facility]    (336) 272-1050   °  [facility] ° [address], [place] Phone:  (336) 832-4444, Fax:  (336) 832-4440 Hours of Operation:  9 am - 6 pm, M-F.  Also accepts Medicaid/Medicare and self-pay.  °[facility] ° [address], [place] Phone: (336) 832-3150, Fax: (336) 832-3151. Hours of Operation:  8:30 am - 5:30 pm, M-F.  Also accepts Medicaid and self-pay.  °[facility] [place] [address], [place] Phone: (336) 878-6027   °[facility] [address], [place], [place] (336)723-1848, Ext. 123 Mondays & Thursdays: 7-9 AM.  First 15 patients are seen on a first come, first serve basis. °  ° °Medicaid-accepting [place]  Providers: ° °Organization         Address  Phone   Notes  °[facility] [address], [place] (336) 641-2100 Also accepts self-pay patients.  °[facility] [address], [place] ° (336) 856-9996   °[facility] [address], [place] (336) 288-8857   °[facility] [address] [place] [address], [place] (336) 299-7000   °Veita Bland [address], [place]  ° (336) 373-1557 Only accepts [place] Access Medicaid patients after they have their name applied to their card.  ° °Self-Pay (no insurance) in [place]: ° °Organization         Address  Phone   Notes  °Sickle Cell Patients, [facility] [address], [place] (336) 832-1970   °[facility] [address], [place] (336) 832-4400   °[facility] ° [address], [place] (336) 992-4800   °[facility]/Dr. Osei-Bonsu ° [address] [place] [address], [place] or [address], [place] (336) 841-8500 Phone number for both [place] and [place] locations is the same.  °[facility] [address], [place] (336) 299-0000   °[facility] [address] [place] [address], [place] or [address] (336) 852-7530 °(336) 878-2260   °[facility] [address], [place] (336) 350-1642, phone; (336) 294-5005, fax Sees patients 1st and 3rd Saturday of every month.  Must not qualify for public or private insurance (i.e. Medicaid, Medicare, [place] Health Choice, Veterans' Benefits) • Household income should be no more than 200% of the poverty level •The clinic cannot treat you if you are pregnant or think you are pregnant • Sexually transmitted diseases are not treated at the clinic.  ° ° °Dental Care: °Organization         Address  Phone  Notes  °[facility] [address],  (336) 641-6152 Accepts children up to age 21 who are enrolled in Medicaid or [place] Health Choice; pregnant women with a Medicaid card; and children who have applied for Medicaid or [place] Health Choice, but were declined, whose parents can pay a reduced fee at time of service.  °[facility] [place]  [address], [place] (336) 641-7733 Accepts children up to age 21 who are enrolled in Medicaid or [place] Health Choice; pregnant women with a Medicaid card; and children who have applied for Medicaid or [place] Health Choice, but were declined, whose parents can pay a reduced fee at time of service.  °[facility] ° [address], [place] (336) 641-4533 Patients are seen by appointment only. Walk-ins are not accepted. [facility] will see patients 18 years of age and older. °Monday - Tuesday (8am-5pm) °Most Wednesdays (8:30-5pm) °$30 per visit, cash only  °[facility] ° [address], [place] (336) 641-4533 Patients are seen by appointment only. Walk-ins are not accepted. [facility] will see patients 18 years of age and older. °One Wednesday Evening (Monthly: Volunteer Based).  $30 per visit, cash only  °[facility]  (919) 537-3737 for adults; Children under age 4, call Graduate Pediatric Dentistry at (919) [phone]. Children aged 4-14, please call (919) [phone] to request a pediatric application. ° Dental services are provided in all areas of dental care including fillings, crowns and bridges, complete and partial dentures, implants, gum treatment, root canals, and extractions. Preventive care is also provided. Treatment is provided to both adults and children. °Patients are selected via a lottery and there is often a waiting list. °  °[facility] [address], °[place] ° (336) 763-8833 [email or web address] °  °[facility]  [address], [place], [place] (336)723-1848, Ext. 123 Second and Fourth Thursday of each month, opens at 6:30 AM; Clinic ends at 9 AM.  Patients are seen on a first-come first-served basis, and a limited number are seen during each clinic.  ° °[facility] ° [address], [place], [place] (336) 723-7904   Eligibility Requirements °You must have lived in [place], [place], or [place] counties for at least the last three months. °  You cannot be eligible for state or federal sponsored healthcare insurance, including Veterans Administration, Medicaid, or Medicare. °  You generally cannot be eligible for healthcare insurance through your employer.  °  How to apply: °Eligibility screenings are held every Tuesday and Wednesday afternoon from 1:00 pm until 4:00 pm. You do not need an appointment for the interview!  °[facility] [address], [place], [place] 336-631-2330   °[facility]  336-342-8273   °[facility]  336-703-3100   °[facility]  336-570-6415   ° °Behavioral Health Resources in the Community: °Intensive Outpatient Programs °Organization         Address  Phone  Notes  °[facility] [address], [place], [place] 336-878-6098   °[facility] [address], [place], [place] 336-832-9800   °[facility] [address], [place], [place] ° 336-882-2125   °[facility] [address],  °[place],  1-800-853-5163 or 336-641-4981   °Substance Abuse Resources °Organization         Address  Phone  Notes  °[facility]  336-882-2125   °[facility]  336-784-9470   °[facility]  336-285-9073   °[facility]  336-845-3988   °Residential & Outpatient Substance Abuse Program  1-800-659-3381   °Psychological Services °Organization         Address  Phone  Notes  °[facility]  336- 832-9600     °[facility]  336- 378-7881   °[facility] [address], [place] 1-800-853-5163 or 336-641-4981   ° °Mobile Crisis Teams °Organization         Address  Phone  Notes  °[facility], Mobile Crisis Care Unit  1-877-626-1772   °Assertive °[facility] ° [address] ,  336-834-9664   °  Sharon DeEsch [address] °[place] 336-554-5454   ° °Self-Help/Support Groups °Organization         Address  Phone             Notes  °[facility] - variety of support groups  336- 373-1402 Call for more information  °Narcotics Anonymous (NA), [facility] [address], °[place]  2 meetings at this location  ° °Residential Treatment Programs °Organization         Address  Phone  Notes  °[facility] [address],    °[place]  1-866-801-8205   °[facility] ° [address], [place], [place] 704-293-8524   °[facility] [address], [place] 336-845-3988 Admissions: 8am-3pm M-F  °[facility] [address]    °[place], [place] 336-841-1104   °[facility] [address], [place], [place] 336-379-7146   °[facility] [address]  °[place], [place] 336-285-9073   °[facility] - Intensive Outpatient [address], [place], [place] 336-852-3033   °[facility] [address]  °[place], [place] 1-877-615-2722 or 336-784-9470   °[facility] [address] [place], [place] 336-227-7417 Accepts Medicaid  °[facility] [address]  °[place] 1-800-659-3381 Substance Abuse/Addiction Treatment  ° °[place] [facility] Resources °Organization         Address  Phone  Notes  °[facility]  (888) 581-9988   °Julie Brannon, PhD [address] [place], [place]   (336) 349-5553 or (336) 951-0000    °[facility]   [address] °[place], [place] (336) 349-4454   °[facility] [address], [place], [place] (336) 342-8316 Insurance/Medicaid/sponsorship through [facility]  °[facility] [address]                                    [place], [place] (336) 342-8316 Therapy/tele-psych/case  °[facility] [address]  ° [place], [place] (336) 349-2233    °Dr. Arfeen  (336) 349-4544   °[facility] [place]  [employer] [place] [facility] 1) [address], [place] °2) [address], [place] °3)  [address], [place] (336) 349-3220 °(336) 342-7768 ° °(336) 342-8140   °[place] [facility] (336) 342-1394 or (336) 342-3537 (After Hours)    ° ° ° °

## 2014-03-28 NOTE — ED Notes (Signed)
Pt reports that she feels very stressed over the last two months. Pt was [facility] and treatment two months ago. Tx for bipolar disorder. Pt noted that the medications have not been effective over the last few days and attempted to be [facility]. No appointments were available. Pt denies SI/HI

## 2014-03-28 NOTE — ED Provider Notes (Signed)
CSN: [id number]     Arrival date & time 03/28/14  1129 History   This chart was scribed for non-physician practitioner Jinny Sanders, PA-C working with No att. providers found by Conchita Paris, ED Scribe. This patient was seen in WTR7/WTR7 and the patient's care was started at 12:33 PM.   Chief Complaint  Patient presents with  . Panic Attack    pt reports intermittent panoic attacks x 2 months. Denies SI/HI   The history is provided by the patient. No language interpreter was used.    HPI Comments: Pamela Chandler is a 44 y.o. female with a history of bipolar disorder who presents to the Emergency Department complaining of panic attacks that have been ongoing for a couple months. Panic attacks have become more frequent and more intense in the last 24 hours. Pt states her heart and thoughts begin to race, also her muscles tighten up and she shakes. She says she has been feeling overwhelmed and very stressed. A recent break up, her moving from her home of 20 years and her job are all contributing factors to her panic attacks. She thinks life is too hard and she cannot make it through. Pt states she is most stressed in the morning but this improves after 1-2 hours. Pt notes her mood is very atypical of her she is usually a very happy person and wonderful mother. Patient denies suicidal or homicidal ideation. Pt was seen [facility] 3 weeks ago and 3 days ago but had not been able to be seen.  No alcohol or illicit drug use. She has had nausea, CP, left arm pain during her episodes. Pt denies SOB, fever and vomiting. Patient reports complete resolution of her symptoms when she is able to calm herself down.  Past Medical History  Diagnosis Date  . Depression   . Elevated cholesterol with elevated triglycerides   . Partial Achilles tendon tear Dr.Olin, (PT)  . Obesity, unspecified   . Bipolar 1 disorder, depressed   . Hypertension     with pregnency   Past Surgical History  Procedure  Laterality Date  . Lasik  both eyes, 2008  . Laparoscopic cholecystectomy  2002  . 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 Heart attack Father 45  . Hypertension Father   . Hypertension Sister   . Leukemia Maternal Uncle   . Cancer Paternal Aunt     brain cancer  . Cancer Maternal Grandmother     oral cancer?   History  Substance Use Topics  . Smoking status: Former Smoker    Quit date: 11/30/2008  . Smokeless tobacco: Not on file  . Alcohol Use: No     Comment: 1 drink 1-2 times per month   OB History    No data available     Review of Systems  Cardiovascular: Positive for palpitations.  Psychiatric/Behavioral: Positive for suicidal ideas and dysphoric mood. The patient is nervous/anxious.     Allergies  Review of patient's allergies indicates no known allergies.  Home Medications   Prior to Admission medications   Medication Sig Start Date End Date Taking? Authorizing Provider  ibuprofen (ADVIL,MOTRIN) 200 MG tablet Take 600 mg by mouth every 6 (six) hours as needed for headache or moderate pain.   Yes Historical Provider, MD  lurasidone (LATUDA) 40 MG TABS tablet Take 40 mg by mouth every evening.   Yes Historical Provider, MD  LORazepam (ATIVAN) 1  MG tablet Take 1 tablet (1 mg total) by mouth every 8 (eight) hours as needed for anxiety. 03/28/14   Monte FantasiaJoseph W Eudell Julian, PA-C   BP 142/67 mmHg  Pulse 90  Temp(Src) 98.4 F (36.9 C) (Oral)  Resp 18  Wt 140 lb (63.504 kg)  SpO2 96% Physical Exam  Constitutional: She is oriented to person, place, and time. She appears well-developed and well-nourished. No distress.  HENT:  Head: Normocephalic and atraumatic.  Mouth/Throat: Oropharynx is clear and moist. No oropharyngeal exudate.  Eyes: Pupils are equal, round, and reactive to light. Right eye exhibits no discharge. Left eye exhibits no discharge. No scleral icterus.  Neck: Normal range of motion.  Cardiovascular:  Normal rate, regular rhythm and normal heart sounds.   No murmur heard. Pulmonary/Chest: Effort normal and breath sounds normal. No respiratory distress.  Abdominal: Soft. There is no tenderness.  Musculoskeletal: Normal range of motion. She exhibits no edema or tenderness.  Neurological: She is alert and oriented to person, place, and time. No cranial nerve deficit. Coordination normal.  Skin: Skin is warm and dry. No rash noted. She is not diaphoretic.  Psychiatric: She has a normal mood and affect. Her behavior is normal.  Patient's mood is euthymic with affect appropriate to mood. Speech is normal and communicative. Behavior is appropriate. Thought processes are appropriate and goal directed. Judgment and insight are appropriate.  Nursing note and vitals reviewed.  ED Course  Procedures  DIAGNOSTIC STUDIES: Oxygen Saturation is 98% on room air, normal by my interpretation.    COORDINATION OF CARE: 12:41 PM Discussed treatment plan with pt at bedside and pt agreed to plan.  Labs Review Labs Reviewed  CBC WITH DIFFERENTIAL/PLATELET  BASIC METABOLIC PANEL  URINALYSIS, ROUTINE W REFLEX MICROSCOPIC  I-STAT TROPOININ, ED    Imaging Review No results found.   EKG Interpretation None      MDM   Final diagnoses:  Anxiety   Since patient has not had a medical workup for these panic attacks in the past, and will provide one today as patient has described some chest discomfort when she gets the panic attacks. Patient has been completely asymptomatic in the ER. Patient remains afebrile, non-tachycardic, nontachypneic, non-hypoxic, well-appearing and in no acute distress. Wells criteria 0.0 for PE.  Patient refusing chest x-ray for this workup. Heart Score <3. ymptoms are exacerbated by her stress level. Likely patient is experiencing symptoms symptoms consistent with anxiety. Patient continuously denies suicidal or homicidal ideation after being asked several times. I strongly  recommended patient follow-up with [facility], as they will be was a manage her medications long-term. I stated that patient can be provided with a short course of anxiolytics, however these were not to be refilled in the emergency room. I discussed return precautions with patient, and patient verbalizes understanding and agreement with this plan. I encouraged patient call or return to the ER if any questions or concerns.  BP 142/67 mmHg  Pulse 90  Temp(Src) 98.4 F (36.9 C) (Oral)  Resp 18  Wt 140 lb (63.504 kg)  SpO2 96%  Signed,  Ladona MowJoe Adilee Lemme, PA-C 10:42 PM  Patient discussed with Dr. Bethann BerkshireJoseph Zammit, MD.      Monte FantasiaJoseph W Shelagh Rayman, PA-C 03/28/14 2243  Benny LennertJoseph L Zammit, MD 03/29/14 [phone]

## 2014-03-30 ENCOUNTER — Ambulatory Visit: Payer: Self-pay | Admitting: Family Medicine

## 2014-03-31 ENCOUNTER — Ambulatory Visit: Payer: Self-pay | Admitting: Medical

## 2014-09-17 ENCOUNTER — Other Ambulatory Visit: Payer: Self-pay

## 2014-09-17 DIAGNOSIS — R5381 Other malaise: Secondary | ICD-10-CM

## 2014-09-18 ENCOUNTER — Other Ambulatory Visit: Payer: Self-pay | Admitting: Family Medicine

## 2014-09-18 DIAGNOSIS — N6310 Unspecified lump in the right breast, unspecified quadrant: Secondary | ICD-10-CM

## 2014-09-23 ENCOUNTER — Other Ambulatory Visit: Payer: BLUE CROSS/BLUE SHIELD

## 2016-08-21 ENCOUNTER — Other Ambulatory Visit: Payer: Self-pay | Admitting: Obstetrics and Gynecology

## 2016-08-21 DIAGNOSIS — N63 Unspecified lump in unspecified breast: Secondary | ICD-10-CM

## 2016-08-23 ENCOUNTER — Other Ambulatory Visit: Payer: BLUE CROSS/BLUE SHIELD

## 2016-09-01 ENCOUNTER — Ambulatory Visit
Admission: RE | Admit: 2016-09-01 | Discharge: 2016-09-01 | Disposition: A | Discharge status: Home / Self Care | Payer: 59 | Source: Ambulatory Visit | Attending: Obstetrics and Gynecology | Admitting: Obstetrics and Gynecology

## 2016-09-01 ENCOUNTER — Other Ambulatory Visit: Payer: Self-pay | Admitting: Obstetrics and Gynecology

## 2016-09-01 DIAGNOSIS — N63 Unspecified lump in unspecified breast: Secondary | ICD-10-CM

## 2017-03-02 DIAGNOSIS — L821 Other seborrheic keratosis: Secondary | ICD-10-CM | POA: Diagnosis not present

## 2017-03-02 DIAGNOSIS — H40023 Open angle with borderline findings, high risk, bilateral: Secondary | ICD-10-CM | POA: Diagnosis not present

## 2017-03-02 DIAGNOSIS — L7211 Pilar cyst: Secondary | ICD-10-CM | POA: Diagnosis not present

## 2017-04-12 DIAGNOSIS — L7211 Pilar cyst: Secondary | ICD-10-CM | POA: Diagnosis not present

## 2017-09-06 ENCOUNTER — Other Ambulatory Visit: Payer: Self-pay | Admitting: Obstetrics and Gynecology

## 2017-09-06 DIAGNOSIS — Z6828 Body mass index (BMI) 28.0-28.9, adult: Secondary | ICD-10-CM | POA: Diagnosis not present

## 2017-09-06 DIAGNOSIS — Z01419 Encounter for gynecological examination (general) (routine) without abnormal findings: Secondary | ICD-10-CM | POA: Diagnosis not present

## 2017-09-10 ENCOUNTER — Other Ambulatory Visit: Payer: Self-pay | Admitting: Obstetrics and Gynecology

## 2017-09-10 DIAGNOSIS — N6022 Fibroadenosis of left breast: Principal | ICD-10-CM

## 2017-09-10 DIAGNOSIS — N6021 Fibroadenosis of right breast: Secondary | ICD-10-CM

## 2017-09-14 ENCOUNTER — Other Ambulatory Visit: Payer: 59

## 2017-09-24 DIAGNOSIS — J069 Acute upper respiratory infection, unspecified: Secondary | ICD-10-CM | POA: Diagnosis not present

## 2017-10-12 ENCOUNTER — Other Ambulatory Visit: Payer: 59

## 2018-04-05 DIAGNOSIS — H524 Presbyopia: Secondary | ICD-10-CM | POA: Diagnosis not present

## 2018-04-05 DIAGNOSIS — H40023 Open angle with borderline findings, high risk, bilateral: Secondary | ICD-10-CM | POA: Diagnosis not present

## 2018-04-05 DIAGNOSIS — H5213 Myopia, bilateral: Secondary | ICD-10-CM | POA: Diagnosis not present

## 2018-07-17 ENCOUNTER — Ambulatory Visit (HOSPITAL_COMMUNITY): Payer: 59

## 2018-07-17 ENCOUNTER — Ambulatory Visit (INDEPENDENT_AMBULATORY_CARE_PROVIDER_SITE_OTHER): Payer: 59

## 2018-07-17 ENCOUNTER — Encounter (HOSPITAL_COMMUNITY): Payer: Self-pay | Admitting: Emergency Medicine

## 2018-07-17 ENCOUNTER — Ambulatory Visit (HOSPITAL_COMMUNITY)
Admission: EM | Admit: 2018-07-17 | Discharge: 2018-07-17 | Disposition: A | Discharge status: Home / Self Care | Payer: 59 | Attending: Internal Medicine | Admitting: Internal Medicine

## 2018-07-17 ENCOUNTER — Other Ambulatory Visit: Payer: Self-pay

## 2018-07-17 DIAGNOSIS — R042 Hemoptysis: Secondary | ICD-10-CM | POA: Diagnosis not present

## 2018-07-17 NOTE — ED Triage Notes (Signed)
Pt sts while eating lunch today she coughed and noticed some sputum with blood; pt denies other sx of other similar event

## 2018-07-17 NOTE — Discharge Instructions (Signed)
Please continue to monitor for persistent blood with coughing- if so please follow up with your primary care for CT scan of chest.

## 2018-07-18 NOTE — ED Provider Notes (Signed)
[facility]    CSN: [id number] Arrival date & time: 07/17/18  1700      History   Chief Complaint Chief Complaint  Patient presents with  . Hemoptysis    HPI Pamela Chandler is a 48 y.o. female history of previous tobacco use, hyperlipidemia, presenting today for evaluation of hemoptysis.  Patient states that earlier today at lunch she had a coughing spell and ended up coughing up blood.  She took a picture of this and does appear significantly bloody, mixed with mucus.  She denies persistent coughing her persistent episodes of hemoptysis.  This was a isolated incident.  She denies any fevers chills or body aches.  Denies feeling under the weather.  She does endorse an approximately 30-pack-year history, states that she smoked for approximately 15 years and smoke approximately 2 packs a day.  She quit a few years ago.  Denies any known exposure to COVID.  Concerned as her father had lung cancer with similar symptoms.  HPI  Past Medical History:  Diagnosis Date  . Bipolar 1 disorder, depressed (HCC)   . Depression   . Elevated cholesterol with elevated triglycerides   . Hypertension    with pregnency  . Obesity, unspecified   . Partial Achilles tendon tear Dr.Olin, (PT)    Patient Active Problem List   Diagnosis Date Noted  . NONSPECIFIC ABN FINDING RAD & OTH EXAM GI TRACT 09/22/2008    Past Surgical History:  Procedure Laterality Date  . 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 BREAST EXCISIONAL BIOPSY     2001 right abscess lanced  . LAPAROSCOPIC CHOLECYSTECTOMY  2002  . LASIK  both eyes, 2008    OB History   No obstetric history on file.      Home Medications    Prior to Admission medications   Medication Sig Start Date End Date Taking? Authorizing Provider  ibuprofen (ADVIL,MOTRIN) 200 MG tablet Take 600 mg by mouth every 6 (six) hours as needed for headache or moderate pain.    [provider]  LORazepam (ATIVAN) 1 MG tablet Take 1  tablet (1 mg total) by mouth every 8 (eight) hours as needed for anxiety. 03/28/14   Ladona MowMintz, Joe, PA-C  lurasidone (LATUDA) 40 MG TABS tablet Take 40 mg by mouth every evening.    [provider]    Family History Family History  Problem Relation Age of Onset  . Heart attack Father [id number]  . Hypertension Father   . Hypertension Sister   . Leukemia Maternal Uncle   . Cancer Paternal Aunt        brain cancer  . Cancer Maternal Grandmother        oral cancer?    Social History Social History   Tobacco Use  . Smoking status: Former Smoker    Quit date: 11/30/2008    Years since quitting: 9.6  Substance Use Topics  . Alcohol use: No    Comment: 1 drink 1-2 times per month  . Drug use: No     Allergies   Patient has no known allergies.   Review of Systems Review of Systems  Constitutional: Negative for activity change, appetite change, chills, fatigue and fever.  HENT: Negative for congestion, ear pain, rhinorrhea, sinus pressure, sore throat and trouble swallowing.   Eyes: Negative for discharge and redness.  Respiratory: Positive for cough. Negative for chest tightness and shortness of breath.   Cardiovascular: Negative for chest pain.  Gastrointestinal: Negative for abdominal  pain, diarrhea, nausea and vomiting.  Musculoskeletal: Negative for myalgias.  Skin: Negative for rash.  Neurological: Negative for dizziness, light-headedness and headaches.     Physical Exam Triage Vital Signs ED Triage Vitals  Enc Vitals Group     BP 07/17/18 1723 134/79     Pulse Rate 07/17/18 1723 91     Resp 07/17/18 1723 18     Temp 07/17/18 1723 98.4 F (36.9 C)     Temp Source 07/17/18 1723 Oral     SpO2 07/17/18 1723 98 %     Weight --      Height --      Head Circumference --      Peak Flow --      Pain Score 07/17/18 1724 0     Pain Loc --      Pain Edu? --      Excl. in [place]? --    No data found.  Updated Vital Signs BP 134/79 (BP Location: Right Arm)   Pulse 91    Temp 98.4 F (36.9 C) (Oral)   Resp 18   SpO2 98%   Visual Acuity Right Eye Distance:   Left Eye Distance:   Bilateral Distance:    Right Eye Near:   Left Eye Near:    Bilateral Near:     Physical Exam Vitals signs and nursing note reviewed.  Constitutional:      General: She is not in acute distress.    Appearance: She is well-developed.  HENT:     Head: Normocephalic and atraumatic.     Mouth/Throat:     Comments: Oral mucosa pink and moist, no tonsillar enlargement or exudate. Posterior pharynx patent and nonerythematous, no uvula deviation or swelling. Normal phonation. Eyes:     Conjunctiva/sclera: Conjunctivae normal.  Neck:     Musculoskeletal: Neck supple.  Cardiovascular:     Rate and Rhythm: Normal rate and regular rhythm.     Heart sounds: No murmur.  Pulmonary:     Effort: Pulmonary effort is normal. No respiratory distress.     Breath sounds: Normal breath sounds.     Comments: Breathing comfortably at rest, CTABL, no wheezing, rales or other adventitious sounds auscultated Abdominal:     Palpations: Abdomen is soft.     Tenderness: There is no abdominal tenderness.  Skin:    General: Skin is warm and dry.  Neurological:     Mental Status: She is alert.      UC Treatments / Results  Labs (all labs ordered are listed, but only abnormal results are displayed) Labs Reviewed - No data to display  EKG None  Radiology Dg Chest 2 View  Result Date: 07/17/2018 CLINICAL DATA:  Hemoptysis EXAM: CHEST - 2 VIEW COMPARISON:  The patient's prior x-ray from 2007 could not be retrieved for comparison. FINDINGS: The heart size and mediastinal contours are within normal limits. Both lungs are clear. The visualized skeletal structures are unremarkable. IMPRESSION: No active cardiopulmonary disease. Electronically Signed   By: Constance Holster M.D.   On: 07/17/2018 18:37    Procedures Procedures (including critical care time)  Medications Ordered in UC  Medications - No data to display  Initial Impression / Assessment and Plan / UC Course  I have reviewed the triage vital signs and the nursing notes.  Pertinent labs & imaging results that were available during my care of the patient were reviewed by me and considered in my medical decision making (see chart for details).  Isolated incident of hemoptysis today with no persistent URI/lower respiratory symptoms.  Obtained x-ray given smoking history.  No sign of nodules or other abnormalities noted on chest x-ray.  Discussed with patient CT is more sensitive to detecting cancer.  Advised if she has persistent symptoms to follow-up with primary care so they may order this outpatient.  If symptoms significantly progressing or worsening to follow-up in emergency room.  Patient declined COVID testing.  Continue to monitor,Discussed strict return precautions. Patient verbalized understanding and is agreeable with plan.  Final Clinical Impressions(s) / UC Diagnoses   Final diagnoses:  Hemoptysis     Discharge Instructions     Please continue to monitor for persistent blood with coughing- if so please follow up with your primary care for CT scan of chest.      ED Prescriptions    None     Controlled Substance Prescriptions [place] Controlled Substance Registry consulted? Not Applicable   Lew DawesWieters, Iria Jamerson C, [place] JerseyPA-C 07/18/18 [phone]

## 2019-07-29 ENCOUNTER — Ambulatory Visit: Payer: 59 | Admitting: Family Medicine

## 2019-07-29 ENCOUNTER — Other Ambulatory Visit: Payer: Self-pay

## 2019-07-29 ENCOUNTER — Encounter: Payer: Self-pay | Admitting: Family Medicine

## 2019-07-29 DIAGNOSIS — M5441 Lumbago with sciatica, right side: Secondary | ICD-10-CM | POA: Diagnosis not present

## 2019-07-29 MED ORDER — METAXALONE 800 MG PO TABS: 400.0000 mg | ORAL_TABLET | Freq: Three times a day (TID) | ORAL | 6 refills | Status: AC | PRN

## 2019-07-29 NOTE — Patient Instructions (Signed)
   YouTube search:  Physical therapy McKenzie Protocol for lumbar disc herniation

## 2019-07-29 NOTE — Progress Notes (Signed)
Office Visit Note   Patient: Pamela Chandler           Date of Birth: [birth year]           MRN: [id number] Visit Date: 07/29/2019 Requested by: Shirlean Mylar, MD [address] [place],  [place] [address] PCP: Shirlean Mylar, MD  Subjective: Chief Complaint  Patient presents with  . Lower Back - Pain    Pain started 3 weeks ago, after bending over to trim the hedges. Been seeing chiropractor. The pain was mainly on the right side. It no longer hurts there, but does hurt down the back of her leg, with numbness in the foot.    HPI: She's here with low back and right leg pain.  Onset about 3 weeks ago when standing upright after bending down to pull weeds.  Immediate severe pain in right low back with radiation into right posterior hip.  Went to chiropractor ([facility]) with good immediate relief.  A few days later, pain returned.  Went to [employer] again but this time it didn't help.  Was referred to urgent care where she was given prednisone and Zanaflex.  She did take prednisone because she has had pancreatitis in the past and did not want to risk having that again.  Zanaflex helps, but it makes her too drowsy to take during the day.  Her low back pain is substantially better, but she continues to have pain down the right leg with tingling sensation constantly, all the way into the bottom of her foot.  No bowel or bladder dysfunction, no fevers or chills, no rash, no.  She never had pain quite like this.                ROS:   All other systems were reviewed and are negative.  Objective: Vital Signs: There were no vitals taken for this visit.  Physical Exam:  General:  Alert and oriented, in no acute distress. Pulm:  Breathing unlabored. Psy:  Normal mood, congruent affect. Skin: No visible rash Low back: Initially tender today but her pain was near the right SI joint.  She does have some tenderness in the sciatic notch.  Straight leg raise is negative, lower extremity strength  and reflexes are normal except for right Achilles DTR which is absent compared to 2+ on the left.  Imaging: No results found.  Assessment & Plan: 1.  Right-sided sciatica with absent Achilles DTR, consider lumbar disc protrusion. -She will start doing physical therapy McKenzie protocol exercises.  Trial of Skelaxin during the day. -Over the next couple weeks if she fails to improve we will obtain x-rays and MRI scan.     Procedures: No procedures performed  No notes on file     PMFS History: Patient Active Problem List   Diagnosis Date Noted  . NONSPECIFIC ABN FINDING RAD & OTH EXAM GI TRACT 09/22/2008   Past Medical History:  Diagnosis Date  . Bipolar 1 disorder, depressed (HCC)   . Depression   . Elevated cholesterol with elevated triglycerides   . Hypertension    with pregnency  . Obesity, unspecified   . Partial Achilles tendon tear Dr.Olin, (PT)    Family History  Problem Relation Age of Onset  . Heart attack Father 7  . Hypertension Father   . Hypertension Sister   . Leukemia Maternal Uncle   . Cancer Paternal Aunt        brain cancer  . Cancer Maternal Grandmother  oral cancer?    Past Surgical History:  Procedure Laterality Date  . 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 BREAST EXCISIONAL BIOPSY     2001 right abscess lanced  . LAPAROSCOPIC CHOLECYSTECTOMY  2002  . LASIK  both eyes, 2008   Social History   Occupational History  . Not on file  Tobacco Use  . Smoking status: Former Smoker    Quit date: 11/30/2008    Years since quitting: 10.6  Substance and Sexual Activity  . Alcohol use: No    Comment: 1 drink 1-2 times per month  . Drug use: No  . Sexual activity: Not on file

## 2021-03-18 ENCOUNTER — Other Ambulatory Visit: Payer: Self-pay | Admitting: Nurse Practitioner

## 2021-03-18 DIAGNOSIS — N644 Mastodynia: Secondary | ICD-10-CM

## 2021-03-18 DIAGNOSIS — N6320 Unspecified lump in the left breast, unspecified quadrant: Secondary | ICD-10-CM

## 2021-04-06 ENCOUNTER — Other Ambulatory Visit: Payer: Self-pay

## 2021-04-06 ENCOUNTER — Ambulatory Visit
Admission: RE | Admit: 2021-04-06 | Discharge: 2021-04-06 | Disposition: A | Discharge status: Home / Self Care | Payer: BC Managed Care – PPO | Source: Ambulatory Visit | Attending: Nurse Practitioner | Admitting: Nurse Practitioner

## 2021-04-06 ENCOUNTER — Ambulatory Visit: Payer: 59

## 2021-04-06 ENCOUNTER — Ambulatory Visit
Admission: RE | Admit: 2021-04-06 | Discharge: 2021-04-06 | Disposition: A | Discharge status: Home / Self Care | Payer: 59 | Source: Ambulatory Visit | Attending: Nurse Practitioner | Admitting: Nurse Practitioner

## 2021-04-06 DIAGNOSIS — N6320 Unspecified lump in the left breast, unspecified quadrant: Secondary | ICD-10-CM

## 2021-04-06 DIAGNOSIS — N644 Mastodynia: Secondary | ICD-10-CM

## 2023-10-24 ENCOUNTER — Ambulatory Visit (HOSPITAL_BASED_OUTPATIENT_CLINIC_OR_DEPARTMENT_OTHER): Admitting: Family Medicine
